# Patient Record
Sex: Female | Born: 1955 | Hispanic: No | Marital: Married | Smoking: Current every day smoker
Health system: Southern US, Community
[De-identification: ages and names within clinical notes are randomized; demographics above are authoritative.]

## PROBLEM LIST (undated history)

## (undated) DIAGNOSIS — I639 Cerebral infarction, unspecified: Secondary | ICD-10-CM

---

## 2000-08-31 ENCOUNTER — Other Ambulatory Visit: Admission: RE | Admit: 2000-08-31 | Discharge: 2000-08-31 | Payer: Self-pay | Admitting: Obstetrics and Gynecology

## 2004-06-16 ENCOUNTER — Ambulatory Visit: Payer: Self-pay | Admitting: Cardiology

## 2004-06-16 ENCOUNTER — Observation Stay (HOSPITAL_COMMUNITY): Admission: EM | Admit: 2004-06-16 | Discharge: 2004-06-17 | Payer: Self-pay | Admitting: Cardiology

## 2005-12-27 IMAGING — CR DG CHEST 2V
2 series · 2 of 2 positions shown · non-contrast
Comparison: none

CLINICAL DATA: 48-year-old female with chest pain, shortness of breath.  
 2-VIEW CHEST RADIOGRAPH:

[w chest pa]
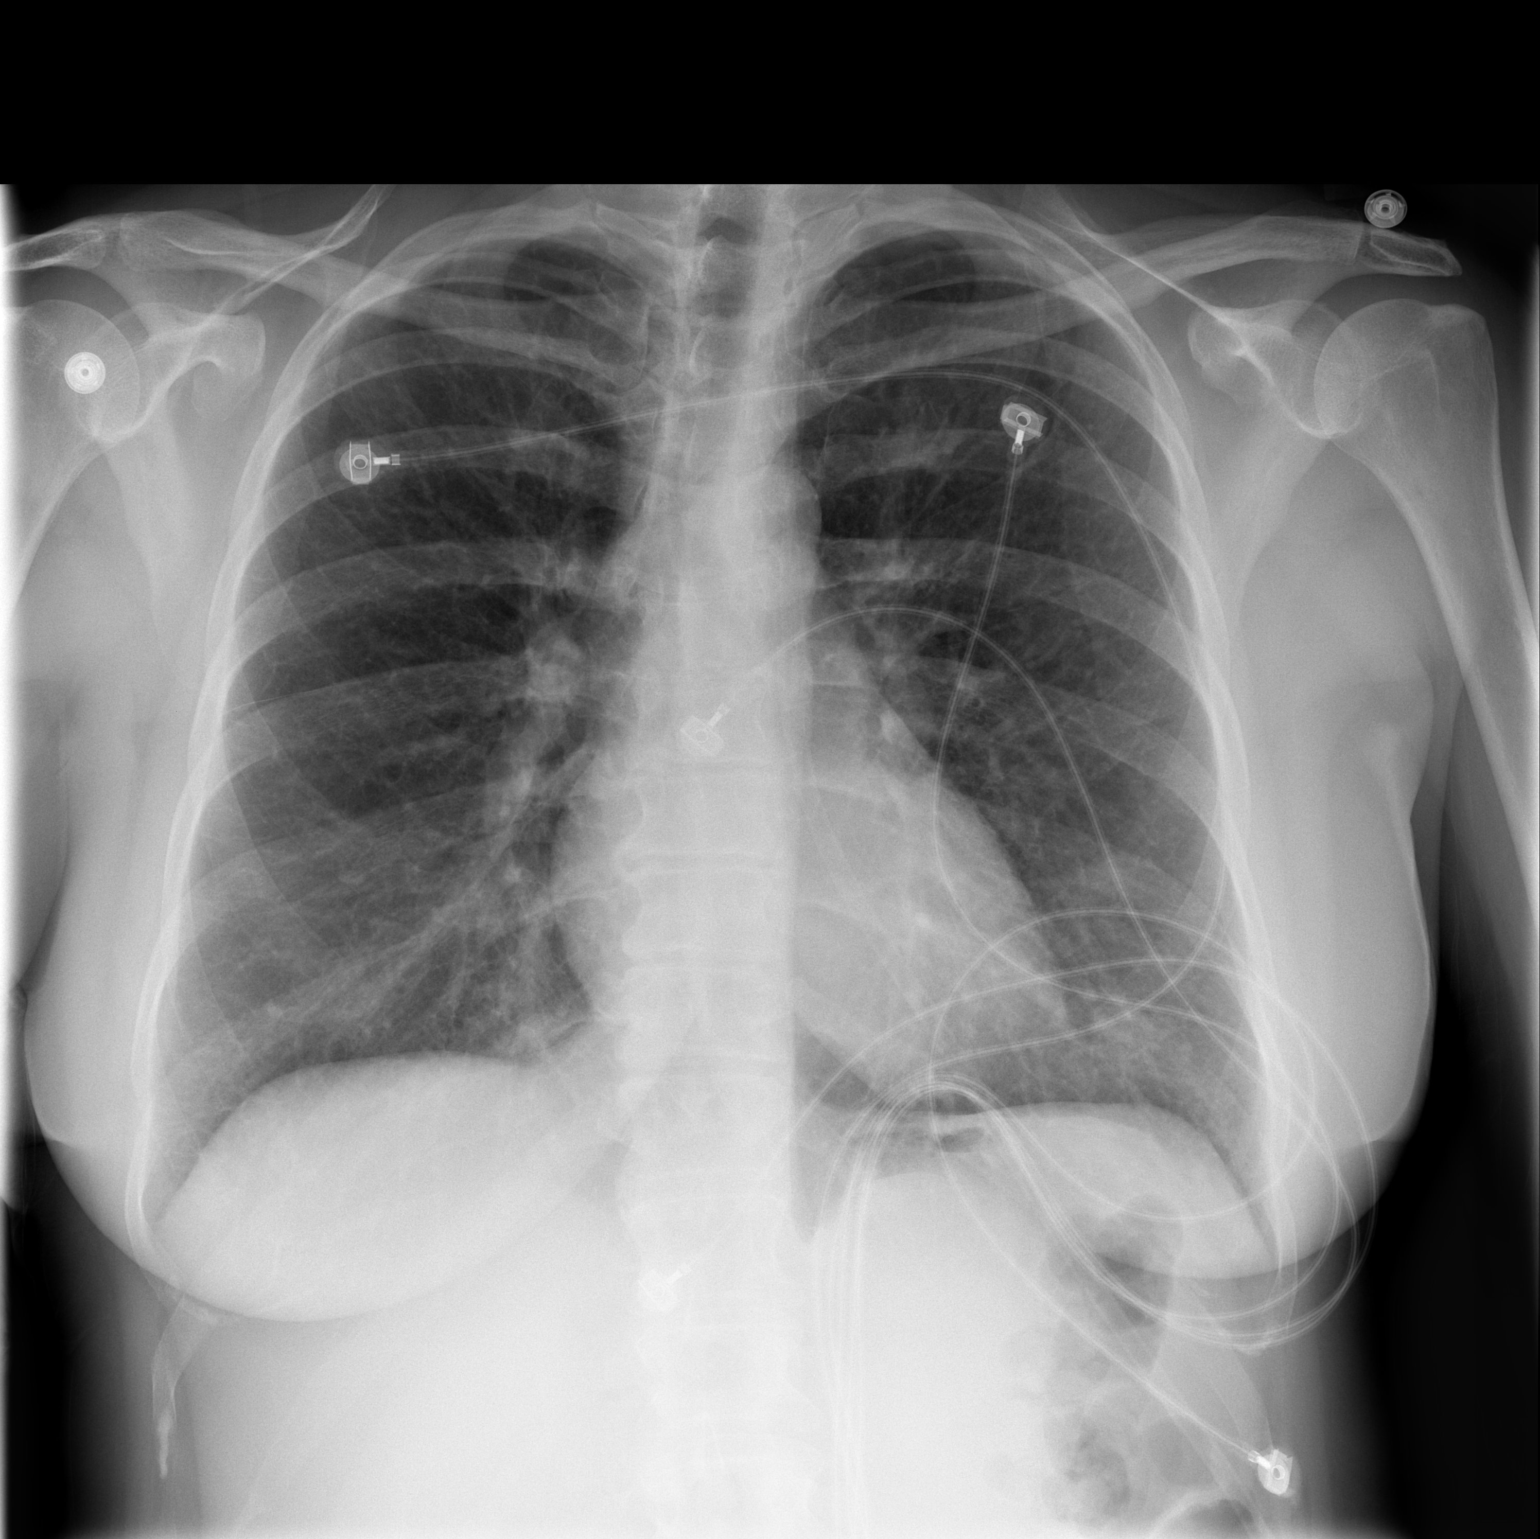

[w chest lat]
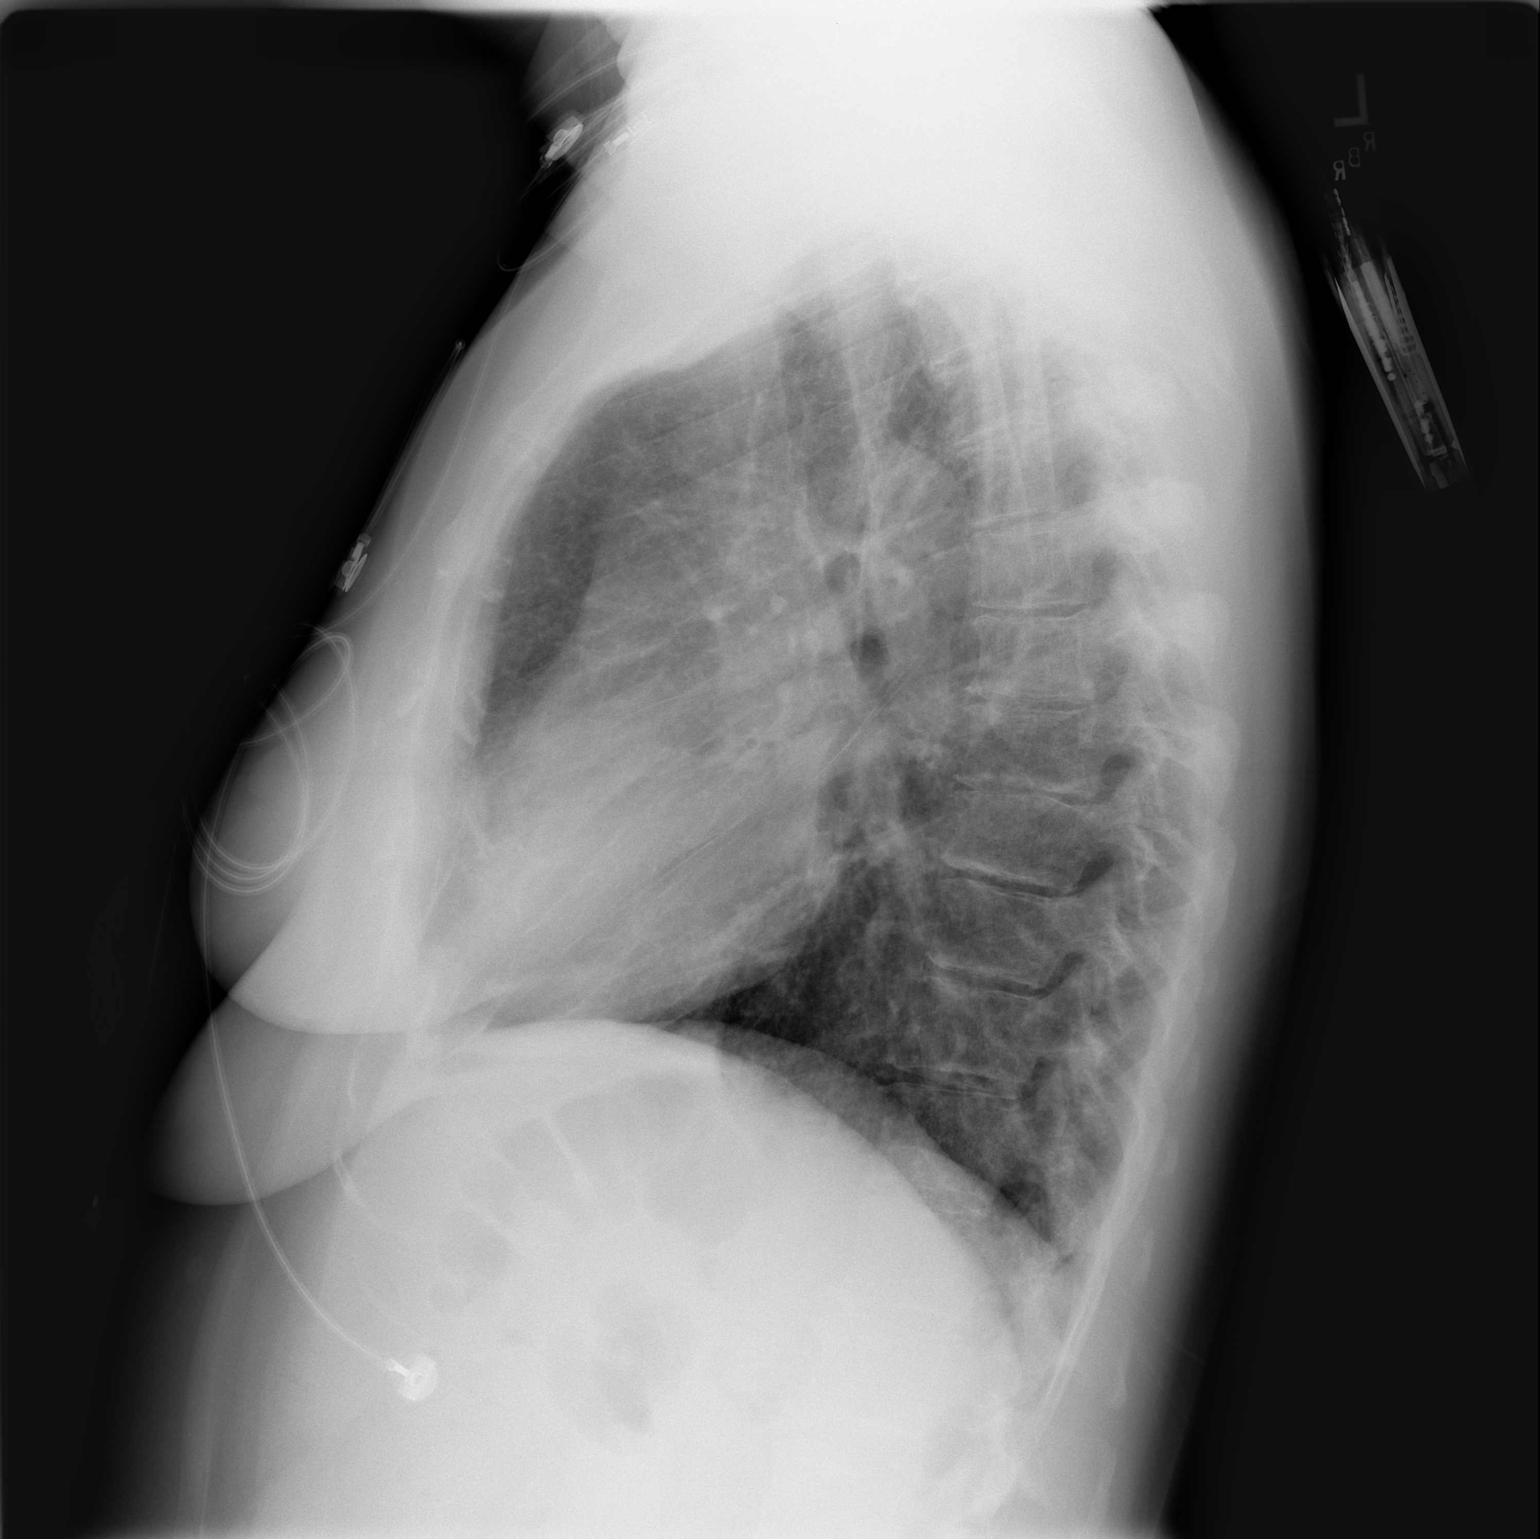

[2 of 2 positions shown; findings below may reference images not displayed]

FINDINGS: Central peribronchial changes are present with interstitial prominence diffusely.  Minimal bibasilar atelectasis.  No CHF, pneumonia, effusion, or pneumothorax.  Normal heart size.
IMPRESSION: 1.  Central peribronchial changes and interstitial prominence.  Query chronic bronchitis or history of smoking.  
 2.  No acute CHF or pneumonia.

## 2011-10-12 ENCOUNTER — Other Ambulatory Visit: Payer: Self-pay | Admitting: Family Medicine

## 2012-05-12 ENCOUNTER — Other Ambulatory Visit: Payer: Self-pay | Admitting: *Deleted

## 2015-03-04 DIAGNOSIS — R69 Illness, unspecified: Secondary | ICD-10-CM | POA: Diagnosis not present

## 2015-03-04 DIAGNOSIS — I10 Essential (primary) hypertension: Secondary | ICD-10-CM | POA: Diagnosis not present

## 2015-05-06 DIAGNOSIS — M5117 Intervertebral disc disorders with radiculopathy, lumbosacral region: Secondary | ICD-10-CM | POA: Diagnosis not present

## 2015-05-06 DIAGNOSIS — M5116 Intervertebral disc disorders with radiculopathy, lumbar region: Secondary | ICD-10-CM | POA: Diagnosis not present

## 2015-05-13 DIAGNOSIS — R69 Illness, unspecified: Secondary | ICD-10-CM | POA: Diagnosis not present

## 2015-05-13 DIAGNOSIS — Z720 Tobacco use: Secondary | ICD-10-CM | POA: Diagnosis not present

## 2015-05-13 DIAGNOSIS — I10 Essential (primary) hypertension: Secondary | ICD-10-CM | POA: Diagnosis not present

## 2015-05-13 DIAGNOSIS — E785 Hyperlipidemia, unspecified: Secondary | ICD-10-CM | POA: Diagnosis not present

## 2015-07-15 DIAGNOSIS — Z79899 Other long term (current) drug therapy: Secondary | ICD-10-CM | POA: Diagnosis not present

## 2015-07-15 DIAGNOSIS — M5116 Intervertebral disc disorders with radiculopathy, lumbar region: Secondary | ICD-10-CM | POA: Diagnosis not present

## 2015-08-09 DIAGNOSIS — I69359 Hemiplegia and hemiparesis following cerebral infarction affecting unspecified side: Secondary | ICD-10-CM | POA: Diagnosis not present

## 2015-08-09 DIAGNOSIS — I10 Essential (primary) hypertension: Secondary | ICD-10-CM | POA: Diagnosis not present

## 2015-08-09 DIAGNOSIS — E785 Hyperlipidemia, unspecified: Secondary | ICD-10-CM | POA: Diagnosis not present

## 2015-08-09 DIAGNOSIS — R69 Illness, unspecified: Secondary | ICD-10-CM | POA: Diagnosis not present

## 2015-10-17 DIAGNOSIS — M5116 Intervertebral disc disorders with radiculopathy, lumbar region: Secondary | ICD-10-CM | POA: Diagnosis not present

## 2015-11-06 DIAGNOSIS — J449 Chronic obstructive pulmonary disease, unspecified: Secondary | ICD-10-CM | POA: Diagnosis not present

## 2015-11-06 DIAGNOSIS — Z87891 Personal history of nicotine dependence: Secondary | ICD-10-CM | POA: Diagnosis not present

## 2015-11-06 DIAGNOSIS — E785 Hyperlipidemia, unspecified: Secondary | ICD-10-CM | POA: Diagnosis not present

## 2015-11-06 DIAGNOSIS — I10 Essential (primary) hypertension: Secondary | ICD-10-CM | POA: Diagnosis not present

## 2015-11-06 DIAGNOSIS — Z720 Tobacco use: Secondary | ICD-10-CM | POA: Diagnosis not present

## 2015-11-18 DIAGNOSIS — Z79899 Other long term (current) drug therapy: Secondary | ICD-10-CM | POA: Diagnosis not present

## 2015-11-18 DIAGNOSIS — M5116 Intervertebral disc disorders with radiculopathy, lumbar region: Secondary | ICD-10-CM | POA: Diagnosis not present

## 2015-11-18 DIAGNOSIS — M5117 Intervertebral disc disorders with radiculopathy, lumbosacral region: Secondary | ICD-10-CM | POA: Diagnosis not present

## 2016-01-09 DIAGNOSIS — Z79899 Other long term (current) drug therapy: Secondary | ICD-10-CM | POA: Diagnosis not present

## 2016-01-09 DIAGNOSIS — M5116 Intervertebral disc disorders with radiculopathy, lumbar region: Secondary | ICD-10-CM | POA: Diagnosis not present

## 2016-01-14 DIAGNOSIS — I10 Essential (primary) hypertension: Secondary | ICD-10-CM | POA: Diagnosis not present

## 2016-01-14 DIAGNOSIS — Z6825 Body mass index (BMI) 25.0-25.9, adult: Secondary | ICD-10-CM | POA: Diagnosis not present

## 2016-01-14 DIAGNOSIS — J410 Simple chronic bronchitis: Secondary | ICD-10-CM | POA: Diagnosis not present

## 2016-01-14 DIAGNOSIS — E782 Mixed hyperlipidemia: Secondary | ICD-10-CM | POA: Diagnosis not present

## 2016-03-18 DIAGNOSIS — Z79899 Other long term (current) drug therapy: Secondary | ICD-10-CM | POA: Diagnosis not present

## 2016-03-18 DIAGNOSIS — M5116 Intervertebral disc disorders with radiculopathy, lumbar region: Secondary | ICD-10-CM | POA: Diagnosis not present

## 2016-04-13 DIAGNOSIS — E785 Hyperlipidemia, unspecified: Secondary | ICD-10-CM | POA: Diagnosis not present

## 2016-04-13 DIAGNOSIS — J449 Chronic obstructive pulmonary disease, unspecified: Secondary | ICD-10-CM | POA: Diagnosis not present

## 2016-04-13 DIAGNOSIS — E871 Hypo-osmolality and hyponatremia: Secondary | ICD-10-CM | POA: Diagnosis not present

## 2016-04-13 DIAGNOSIS — I10 Essential (primary) hypertension: Secondary | ICD-10-CM | POA: Diagnosis not present

## 2016-06-11 DIAGNOSIS — M5116 Intervertebral disc disorders with radiculopathy, lumbar region: Secondary | ICD-10-CM | POA: Diagnosis not present

## 2016-07-15 DIAGNOSIS — I10 Essential (primary) hypertension: Secondary | ICD-10-CM | POA: Diagnosis not present

## 2016-07-15 DIAGNOSIS — E785 Hyperlipidemia, unspecified: Secondary | ICD-10-CM | POA: Diagnosis not present

## 2016-07-15 DIAGNOSIS — J449 Chronic obstructive pulmonary disease, unspecified: Secondary | ICD-10-CM | POA: Diagnosis not present

## 2016-09-03 DIAGNOSIS — Z79891 Long term (current) use of opiate analgesic: Secondary | ICD-10-CM | POA: Diagnosis not present

## 2016-09-03 DIAGNOSIS — M5136 Other intervertebral disc degeneration, lumbar region: Secondary | ICD-10-CM | POA: Diagnosis not present

## 2016-09-03 DIAGNOSIS — Z5181 Encounter for therapeutic drug level monitoring: Secondary | ICD-10-CM | POA: Diagnosis not present

## 2016-09-14 DIAGNOSIS — H524 Presbyopia: Secondary | ICD-10-CM | POA: Diagnosis not present

## 2016-10-13 DIAGNOSIS — H25041 Posterior subcapsular polar age-related cataract, right eye: Secondary | ICD-10-CM | POA: Diagnosis not present

## 2016-10-13 DIAGNOSIS — H40003 Preglaucoma, unspecified, bilateral: Secondary | ICD-10-CM | POA: Diagnosis not present

## 2016-10-13 DIAGNOSIS — H2512 Age-related nuclear cataract, left eye: Secondary | ICD-10-CM | POA: Diagnosis not present

## 2016-10-15 DIAGNOSIS — Z5181 Encounter for therapeutic drug level monitoring: Secondary | ICD-10-CM | POA: Diagnosis not present

## 2016-10-15 DIAGNOSIS — Z79891 Long term (current) use of opiate analgesic: Secondary | ICD-10-CM | POA: Diagnosis not present

## 2016-10-15 DIAGNOSIS — M5136 Other intervertebral disc degeneration, lumbar region: Secondary | ICD-10-CM | POA: Diagnosis not present

## 2016-10-19 DIAGNOSIS — H02041 Spastic entropion of right upper eyelid: Secondary | ICD-10-CM | POA: Diagnosis not present

## 2016-10-19 DIAGNOSIS — H2511 Age-related nuclear cataract, right eye: Secondary | ICD-10-CM | POA: Diagnosis not present

## 2016-10-19 DIAGNOSIS — H25041 Posterior subcapsular polar age-related cataract, right eye: Secondary | ICD-10-CM | POA: Diagnosis not present

## 2016-11-16 DIAGNOSIS — H25042 Posterior subcapsular polar age-related cataract, left eye: Secondary | ICD-10-CM | POA: Diagnosis not present

## 2016-11-16 DIAGNOSIS — H2512 Age-related nuclear cataract, left eye: Secondary | ICD-10-CM | POA: Diagnosis not present

## 2017-01-13 DIAGNOSIS — I10 Essential (primary) hypertension: Secondary | ICD-10-CM | POA: Diagnosis not present

## 2017-01-13 DIAGNOSIS — E785 Hyperlipidemia, unspecified: Secondary | ICD-10-CM | POA: Diagnosis not present

## 2017-01-13 DIAGNOSIS — J449 Chronic obstructive pulmonary disease, unspecified: Secondary | ICD-10-CM | POA: Diagnosis not present

## 2017-01-13 DIAGNOSIS — M5442 Lumbago with sciatica, left side: Secondary | ICD-10-CM | POA: Diagnosis not present

## 2017-01-25 DIAGNOSIS — G8929 Other chronic pain: Secondary | ICD-10-CM | POA: Diagnosis not present

## 2017-01-25 DIAGNOSIS — Z5181 Encounter for therapeutic drug level monitoring: Secondary | ICD-10-CM | POA: Diagnosis not present

## 2017-01-25 DIAGNOSIS — Z87891 Personal history of nicotine dependence: Secondary | ICD-10-CM | POA: Diagnosis not present

## 2017-01-25 DIAGNOSIS — M5442 Lumbago with sciatica, left side: Secondary | ICD-10-CM | POA: Diagnosis not present

## 2017-02-24 DIAGNOSIS — Z5181 Encounter for therapeutic drug level monitoring: Secondary | ICD-10-CM | POA: Diagnosis not present

## 2017-02-24 DIAGNOSIS — I10 Essential (primary) hypertension: Secondary | ICD-10-CM | POA: Diagnosis not present

## 2017-02-24 DIAGNOSIS — M5442 Lumbago with sciatica, left side: Secondary | ICD-10-CM | POA: Diagnosis not present

## 2017-02-24 DIAGNOSIS — E785 Hyperlipidemia, unspecified: Secondary | ICD-10-CM | POA: Diagnosis not present

## 2017-02-24 DIAGNOSIS — J449 Chronic obstructive pulmonary disease, unspecified: Secondary | ICD-10-CM | POA: Diagnosis not present

## 2017-02-24 DIAGNOSIS — Z79899 Other long term (current) drug therapy: Secondary | ICD-10-CM | POA: Diagnosis not present

## 2017-03-26 DIAGNOSIS — I10 Essential (primary) hypertension: Secondary | ICD-10-CM | POA: Diagnosis not present

## 2017-03-26 DIAGNOSIS — M5442 Lumbago with sciatica, left side: Secondary | ICD-10-CM | POA: Diagnosis not present

## 2017-03-26 DIAGNOSIS — G8929 Other chronic pain: Secondary | ICD-10-CM | POA: Diagnosis not present

## 2017-03-26 DIAGNOSIS — K5903 Drug induced constipation: Secondary | ICD-10-CM | POA: Diagnosis not present

## 2017-04-23 DIAGNOSIS — M5442 Lumbago with sciatica, left side: Secondary | ICD-10-CM | POA: Diagnosis not present

## 2017-04-23 DIAGNOSIS — Z79899 Other long term (current) drug therapy: Secondary | ICD-10-CM | POA: Diagnosis not present

## 2017-04-23 DIAGNOSIS — E785 Hyperlipidemia, unspecified: Secondary | ICD-10-CM | POA: Diagnosis not present

## 2017-04-23 DIAGNOSIS — G8929 Other chronic pain: Secondary | ICD-10-CM | POA: Diagnosis not present

## 2017-04-23 DIAGNOSIS — Z5181 Encounter for therapeutic drug level monitoring: Secondary | ICD-10-CM | POA: Diagnosis not present

## 2017-04-23 DIAGNOSIS — I10 Essential (primary) hypertension: Secondary | ICD-10-CM | POA: Diagnosis not present

## 2017-05-24 DIAGNOSIS — Z79899 Other long term (current) drug therapy: Secondary | ICD-10-CM | POA: Diagnosis not present

## 2017-05-24 DIAGNOSIS — E785 Hyperlipidemia, unspecified: Secondary | ICD-10-CM | POA: Diagnosis not present

## 2017-05-24 DIAGNOSIS — G8929 Other chronic pain: Secondary | ICD-10-CM | POA: Diagnosis not present

## 2017-05-24 DIAGNOSIS — I10 Essential (primary) hypertension: Secondary | ICD-10-CM | POA: Diagnosis not present

## 2017-05-24 DIAGNOSIS — M5442 Lumbago with sciatica, left side: Secondary | ICD-10-CM | POA: Diagnosis not present

## 2017-05-24 DIAGNOSIS — Z5181 Encounter for therapeutic drug level monitoring: Secondary | ICD-10-CM | POA: Diagnosis not present

## 2017-06-23 DIAGNOSIS — K5903 Drug induced constipation: Secondary | ICD-10-CM | POA: Diagnosis not present

## 2017-06-23 DIAGNOSIS — E785 Hyperlipidemia, unspecified: Secondary | ICD-10-CM | POA: Diagnosis not present

## 2017-06-23 DIAGNOSIS — Z79899 Other long term (current) drug therapy: Secondary | ICD-10-CM | POA: Diagnosis not present

## 2017-06-23 DIAGNOSIS — I10 Essential (primary) hypertension: Secondary | ICD-10-CM | POA: Diagnosis not present

## 2017-06-23 DIAGNOSIS — M5442 Lumbago with sciatica, left side: Secondary | ICD-10-CM | POA: Diagnosis not present

## 2017-06-23 DIAGNOSIS — Z5181 Encounter for therapeutic drug level monitoring: Secondary | ICD-10-CM | POA: Diagnosis not present

## 2017-06-23 DIAGNOSIS — G8929 Other chronic pain: Secondary | ICD-10-CM | POA: Diagnosis not present

## 2017-07-21 DIAGNOSIS — M5442 Lumbago with sciatica, left side: Secondary | ICD-10-CM | POA: Diagnosis not present

## 2017-07-21 DIAGNOSIS — G8929 Other chronic pain: Secondary | ICD-10-CM | POA: Diagnosis not present

## 2017-07-21 DIAGNOSIS — I10 Essential (primary) hypertension: Secondary | ICD-10-CM | POA: Diagnosis not present

## 2017-07-21 DIAGNOSIS — Z79899 Other long term (current) drug therapy: Secondary | ICD-10-CM | POA: Diagnosis not present

## 2017-07-21 DIAGNOSIS — K5903 Drug induced constipation: Secondary | ICD-10-CM | POA: Diagnosis not present

## 2017-07-21 DIAGNOSIS — Z5181 Encounter for therapeutic drug level monitoring: Secondary | ICD-10-CM | POA: Diagnosis not present

## 2017-08-20 DIAGNOSIS — I10 Essential (primary) hypertension: Secondary | ICD-10-CM | POA: Diagnosis not present

## 2017-08-20 DIAGNOSIS — K5903 Drug induced constipation: Secondary | ICD-10-CM | POA: Diagnosis not present

## 2017-08-20 DIAGNOSIS — Z79899 Other long term (current) drug therapy: Secondary | ICD-10-CM | POA: Diagnosis not present

## 2017-08-20 DIAGNOSIS — Z5181 Encounter for therapeutic drug level monitoring: Secondary | ICD-10-CM | POA: Diagnosis not present

## 2017-08-20 DIAGNOSIS — G8929 Other chronic pain: Secondary | ICD-10-CM | POA: Diagnosis not present

## 2017-08-20 DIAGNOSIS — M5442 Lumbago with sciatica, left side: Secondary | ICD-10-CM | POA: Diagnosis not present

## 2017-09-21 DIAGNOSIS — I10 Essential (primary) hypertension: Secondary | ICD-10-CM | POA: Diagnosis not present

## 2017-09-21 DIAGNOSIS — Z79899 Other long term (current) drug therapy: Secondary | ICD-10-CM | POA: Diagnosis not present

## 2017-09-21 DIAGNOSIS — Z5181 Encounter for therapeutic drug level monitoring: Secondary | ICD-10-CM | POA: Diagnosis not present

## 2017-09-21 DIAGNOSIS — M5442 Lumbago with sciatica, left side: Secondary | ICD-10-CM | POA: Diagnosis not present

## 2017-09-21 DIAGNOSIS — G8929 Other chronic pain: Secondary | ICD-10-CM | POA: Diagnosis not present

## 2017-09-21 DIAGNOSIS — K5903 Drug induced constipation: Secondary | ICD-10-CM | POA: Diagnosis not present

## 2017-09-24 DIAGNOSIS — E7800 Pure hypercholesterolemia, unspecified: Secondary | ICD-10-CM | POA: Diagnosis not present

## 2017-09-24 DIAGNOSIS — Z720 Tobacco use: Secondary | ICD-10-CM | POA: Diagnosis not present

## 2017-09-24 DIAGNOSIS — I10 Essential (primary) hypertension: Secondary | ICD-10-CM | POA: Diagnosis not present

## 2017-09-24 DIAGNOSIS — R7303 Prediabetes: Secondary | ICD-10-CM | POA: Diagnosis not present

## 2017-09-24 DIAGNOSIS — Z1389 Encounter for screening for other disorder: Secondary | ICD-10-CM | POA: Diagnosis not present

## 2017-09-24 DIAGNOSIS — Z87891 Personal history of nicotine dependence: Secondary | ICD-10-CM | POA: Diagnosis not present

## 2017-09-24 DIAGNOSIS — Z0000 Encounter for general adult medical examination without abnormal findings: Secondary | ICD-10-CM | POA: Diagnosis not present

## 2017-10-22 DIAGNOSIS — Z5181 Encounter for therapeutic drug level monitoring: Secondary | ICD-10-CM | POA: Diagnosis not present

## 2017-10-22 DIAGNOSIS — I10 Essential (primary) hypertension: Secondary | ICD-10-CM | POA: Diagnosis not present

## 2017-10-22 DIAGNOSIS — M5442 Lumbago with sciatica, left side: Secondary | ICD-10-CM | POA: Diagnosis not present

## 2017-10-22 DIAGNOSIS — G8929 Other chronic pain: Secondary | ICD-10-CM | POA: Diagnosis not present

## 2017-10-22 DIAGNOSIS — E782 Mixed hyperlipidemia: Secondary | ICD-10-CM | POA: Diagnosis not present

## 2017-10-22 DIAGNOSIS — Z79899 Other long term (current) drug therapy: Secondary | ICD-10-CM | POA: Diagnosis not present

## 2017-11-19 DIAGNOSIS — Z5181 Encounter for therapeutic drug level monitoring: Secondary | ICD-10-CM | POA: Diagnosis not present

## 2017-11-19 DIAGNOSIS — M7062 Trochanteric bursitis, left hip: Secondary | ICD-10-CM | POA: Diagnosis not present

## 2017-11-19 DIAGNOSIS — E782 Mixed hyperlipidemia: Secondary | ICD-10-CM | POA: Diagnosis not present

## 2017-11-19 DIAGNOSIS — G8929 Other chronic pain: Secondary | ICD-10-CM | POA: Diagnosis not present

## 2017-11-19 DIAGNOSIS — Z79899 Other long term (current) drug therapy: Secondary | ICD-10-CM | POA: Diagnosis not present

## 2017-11-19 DIAGNOSIS — M5442 Lumbago with sciatica, left side: Secondary | ICD-10-CM | POA: Diagnosis not present

## 2017-11-29 DIAGNOSIS — M7062 Trochanteric bursitis, left hip: Secondary | ICD-10-CM | POA: Diagnosis not present

## 2017-12-17 DIAGNOSIS — G8929 Other chronic pain: Secondary | ICD-10-CM | POA: Diagnosis not present

## 2017-12-17 DIAGNOSIS — M5442 Lumbago with sciatica, left side: Secondary | ICD-10-CM | POA: Diagnosis not present

## 2017-12-17 DIAGNOSIS — K5903 Drug induced constipation: Secondary | ICD-10-CM | POA: Diagnosis not present

## 2017-12-17 DIAGNOSIS — Z5181 Encounter for therapeutic drug level monitoring: Secondary | ICD-10-CM | POA: Diagnosis not present

## 2017-12-17 DIAGNOSIS — Z79899 Other long term (current) drug therapy: Secondary | ICD-10-CM | POA: Diagnosis not present

## 2017-12-17 DIAGNOSIS — I10 Essential (primary) hypertension: Secondary | ICD-10-CM | POA: Diagnosis not present

## 2018-01-14 DIAGNOSIS — G8929 Other chronic pain: Secondary | ICD-10-CM | POA: Diagnosis not present

## 2018-01-14 DIAGNOSIS — I10 Essential (primary) hypertension: Secondary | ICD-10-CM | POA: Diagnosis not present

## 2018-01-14 DIAGNOSIS — M5442 Lumbago with sciatica, left side: Secondary | ICD-10-CM | POA: Diagnosis not present

## 2018-01-14 DIAGNOSIS — K5903 Drug induced constipation: Secondary | ICD-10-CM | POA: Diagnosis not present

## 2018-02-11 DIAGNOSIS — Z5181 Encounter for therapeutic drug level monitoring: Secondary | ICD-10-CM | POA: Diagnosis not present

## 2018-02-11 DIAGNOSIS — I10 Essential (primary) hypertension: Secondary | ICD-10-CM | POA: Diagnosis not present

## 2018-02-11 DIAGNOSIS — G8929 Other chronic pain: Secondary | ICD-10-CM | POA: Diagnosis not present

## 2018-02-11 DIAGNOSIS — M5442 Lumbago with sciatica, left side: Secondary | ICD-10-CM | POA: Diagnosis not present

## 2018-02-11 DIAGNOSIS — Z79899 Other long term (current) drug therapy: Secondary | ICD-10-CM | POA: Diagnosis not present

## 2018-02-11 DIAGNOSIS — K5903 Drug induced constipation: Secondary | ICD-10-CM | POA: Diagnosis not present

## 2018-03-15 DIAGNOSIS — M5442 Lumbago with sciatica, left side: Secondary | ICD-10-CM | POA: Diagnosis not present

## 2018-03-15 DIAGNOSIS — K5903 Drug induced constipation: Secondary | ICD-10-CM | POA: Diagnosis not present

## 2018-03-15 DIAGNOSIS — Z5181 Encounter for therapeutic drug level monitoring: Secondary | ICD-10-CM | POA: Diagnosis not present

## 2018-03-15 DIAGNOSIS — G8929 Other chronic pain: Secondary | ICD-10-CM | POA: Diagnosis not present

## 2018-03-15 DIAGNOSIS — Z79899 Other long term (current) drug therapy: Secondary | ICD-10-CM | POA: Diagnosis not present

## 2018-03-15 DIAGNOSIS — I10 Essential (primary) hypertension: Secondary | ICD-10-CM | POA: Diagnosis not present

## 2018-03-29 DIAGNOSIS — R39198 Other difficulties with micturition: Secondary | ICD-10-CM | POA: Diagnosis not present

## 2018-04-13 DIAGNOSIS — Z79899 Other long term (current) drug therapy: Secondary | ICD-10-CM | POA: Diagnosis not present

## 2018-04-13 DIAGNOSIS — I10 Essential (primary) hypertension: Secondary | ICD-10-CM | POA: Diagnosis not present

## 2018-04-13 DIAGNOSIS — G8929 Other chronic pain: Secondary | ICD-10-CM | POA: Diagnosis not present

## 2018-04-13 DIAGNOSIS — Z5181 Encounter for therapeutic drug level monitoring: Secondary | ICD-10-CM | POA: Diagnosis not present

## 2018-04-13 DIAGNOSIS — M5442 Lumbago with sciatica, left side: Secondary | ICD-10-CM | POA: Diagnosis not present

## 2018-04-13 DIAGNOSIS — R300 Dysuria: Secondary | ICD-10-CM | POA: Diagnosis not present

## 2018-04-13 DIAGNOSIS — K5903 Drug induced constipation: Secondary | ICD-10-CM | POA: Diagnosis not present

## 2018-05-13 DIAGNOSIS — G8929 Other chronic pain: Secondary | ICD-10-CM | POA: Diagnosis not present

## 2018-05-13 DIAGNOSIS — K5903 Drug induced constipation: Secondary | ICD-10-CM | POA: Diagnosis not present

## 2018-05-13 DIAGNOSIS — I10 Essential (primary) hypertension: Secondary | ICD-10-CM | POA: Diagnosis not present

## 2018-05-13 DIAGNOSIS — E871 Hypo-osmolality and hyponatremia: Secondary | ICD-10-CM | POA: Diagnosis not present

## 2018-05-13 DIAGNOSIS — M5442 Lumbago with sciatica, left side: Secondary | ICD-10-CM | POA: Diagnosis not present

## 2018-05-13 DIAGNOSIS — Z5181 Encounter for therapeutic drug level monitoring: Secondary | ICD-10-CM | POA: Diagnosis not present

## 2018-05-13 DIAGNOSIS — Z79899 Other long term (current) drug therapy: Secondary | ICD-10-CM | POA: Diagnosis not present

## 2018-06-10 DIAGNOSIS — K5903 Drug induced constipation: Secondary | ICD-10-CM | POA: Diagnosis not present

## 2018-06-10 DIAGNOSIS — I10 Essential (primary) hypertension: Secondary | ICD-10-CM | POA: Diagnosis not present

## 2018-06-10 DIAGNOSIS — Z5181 Encounter for therapeutic drug level monitoring: Secondary | ICD-10-CM | POA: Diagnosis not present

## 2018-06-10 DIAGNOSIS — G8929 Other chronic pain: Secondary | ICD-10-CM | POA: Diagnosis not present

## 2018-06-10 DIAGNOSIS — M5442 Lumbago with sciatica, left side: Secondary | ICD-10-CM | POA: Diagnosis not present

## 2018-06-10 DIAGNOSIS — Z79899 Other long term (current) drug therapy: Secondary | ICD-10-CM | POA: Diagnosis not present

## 2018-07-11 DIAGNOSIS — Z5181 Encounter for therapeutic drug level monitoring: Secondary | ICD-10-CM | POA: Diagnosis not present

## 2018-07-11 DIAGNOSIS — R69 Illness, unspecified: Secondary | ICD-10-CM | POA: Diagnosis not present

## 2018-07-11 DIAGNOSIS — M5442 Lumbago with sciatica, left side: Secondary | ICD-10-CM | POA: Diagnosis not present

## 2018-07-11 DIAGNOSIS — K5903 Drug induced constipation: Secondary | ICD-10-CM | POA: Diagnosis not present

## 2018-07-11 DIAGNOSIS — G8929 Other chronic pain: Secondary | ICD-10-CM | POA: Diagnosis not present

## 2018-07-11 DIAGNOSIS — Z79899 Other long term (current) drug therapy: Secondary | ICD-10-CM | POA: Diagnosis not present

## 2018-07-11 DIAGNOSIS — I10 Essential (primary) hypertension: Secondary | ICD-10-CM | POA: Diagnosis not present

## 2018-08-10 DIAGNOSIS — G8929 Other chronic pain: Secondary | ICD-10-CM | POA: Diagnosis not present

## 2018-08-10 DIAGNOSIS — I10 Essential (primary) hypertension: Secondary | ICD-10-CM | POA: Diagnosis not present

## 2018-08-10 DIAGNOSIS — M5442 Lumbago with sciatica, left side: Secondary | ICD-10-CM | POA: Diagnosis not present

## 2018-08-10 DIAGNOSIS — Z79899 Other long term (current) drug therapy: Secondary | ICD-10-CM | POA: Diagnosis not present

## 2018-08-10 DIAGNOSIS — Z5181 Encounter for therapeutic drug level monitoring: Secondary | ICD-10-CM | POA: Diagnosis not present

## 2018-09-12 DIAGNOSIS — M5442 Lumbago with sciatica, left side: Secondary | ICD-10-CM | POA: Diagnosis not present

## 2018-09-12 DIAGNOSIS — G8929 Other chronic pain: Secondary | ICD-10-CM | POA: Diagnosis not present

## 2018-09-12 DIAGNOSIS — I10 Essential (primary) hypertension: Secondary | ICD-10-CM | POA: Diagnosis not present

## 2018-09-12 DIAGNOSIS — Z5181 Encounter for therapeutic drug level monitoring: Secondary | ICD-10-CM | POA: Diagnosis not present

## 2018-09-12 DIAGNOSIS — Z79899 Other long term (current) drug therapy: Secondary | ICD-10-CM | POA: Diagnosis not present

## 2018-10-12 DIAGNOSIS — G8929 Other chronic pain: Secondary | ICD-10-CM | POA: Diagnosis not present

## 2018-10-12 DIAGNOSIS — I10 Essential (primary) hypertension: Secondary | ICD-10-CM | POA: Diagnosis not present

## 2018-10-12 DIAGNOSIS — M5442 Lumbago with sciatica, left side: Secondary | ICD-10-CM | POA: Diagnosis not present

## 2018-10-12 DIAGNOSIS — R69 Illness, unspecified: Secondary | ICD-10-CM | POA: Diagnosis not present

## 2018-11-11 DIAGNOSIS — G8929 Other chronic pain: Secondary | ICD-10-CM | POA: Diagnosis not present

## 2018-11-11 DIAGNOSIS — M5442 Lumbago with sciatica, left side: Secondary | ICD-10-CM | POA: Diagnosis not present

## 2018-11-11 DIAGNOSIS — Z79899 Other long term (current) drug therapy: Secondary | ICD-10-CM | POA: Diagnosis not present

## 2018-11-11 DIAGNOSIS — Z5181 Encounter for therapeutic drug level monitoring: Secondary | ICD-10-CM | POA: Diagnosis not present

## 2018-11-11 DIAGNOSIS — I10 Essential (primary) hypertension: Secondary | ICD-10-CM | POA: Diagnosis not present

## 2018-12-05 DIAGNOSIS — I10 Essential (primary) hypertension: Secondary | ICD-10-CM | POA: Diagnosis not present

## 2018-12-05 DIAGNOSIS — Z1389 Encounter for screening for other disorder: Secondary | ICD-10-CM | POA: Diagnosis not present

## 2018-12-05 DIAGNOSIS — Z0000 Encounter for general adult medical examination without abnormal findings: Secondary | ICD-10-CM | POA: Diagnosis not present

## 2018-12-05 DIAGNOSIS — G8194 Hemiplegia, unspecified affecting left nondominant side: Secondary | ICD-10-CM | POA: Diagnosis not present

## 2018-12-05 DIAGNOSIS — Z87891 Personal history of nicotine dependence: Secondary | ICD-10-CM | POA: Diagnosis not present

## 2018-12-05 DIAGNOSIS — Z23 Encounter for immunization: Secondary | ICD-10-CM | POA: Diagnosis not present

## 2018-12-05 DIAGNOSIS — R4182 Altered mental status, unspecified: Secondary | ICD-10-CM | POA: Diagnosis not present

## 2018-12-05 DIAGNOSIS — E782 Mixed hyperlipidemia: Secondary | ICD-10-CM | POA: Diagnosis not present

## 2018-12-13 DIAGNOSIS — R4182 Altered mental status, unspecified: Secondary | ICD-10-CM | POA: Diagnosis not present

## 2018-12-16 DIAGNOSIS — R39198 Other difficulties with micturition: Secondary | ICD-10-CM | POA: Diagnosis not present

## 2018-12-16 DIAGNOSIS — Z79899 Other long term (current) drug therapy: Secondary | ICD-10-CM | POA: Diagnosis not present

## 2018-12-16 DIAGNOSIS — M5442 Lumbago with sciatica, left side: Secondary | ICD-10-CM | POA: Diagnosis not present

## 2018-12-16 DIAGNOSIS — G8929 Other chronic pain: Secondary | ICD-10-CM | POA: Diagnosis not present

## 2018-12-16 DIAGNOSIS — Z5181 Encounter for therapeutic drug level monitoring: Secondary | ICD-10-CM | POA: Diagnosis not present

## 2018-12-16 DIAGNOSIS — R69 Illness, unspecified: Secondary | ICD-10-CM | POA: Diagnosis not present

## 2019-01-13 DIAGNOSIS — Z79899 Other long term (current) drug therapy: Secondary | ICD-10-CM | POA: Diagnosis not present

## 2019-01-13 DIAGNOSIS — R69 Illness, unspecified: Secondary | ICD-10-CM | POA: Diagnosis not present

## 2019-01-13 DIAGNOSIS — Z5181 Encounter for therapeutic drug level monitoring: Secondary | ICD-10-CM | POA: Diagnosis not present

## 2019-01-13 DIAGNOSIS — I10 Essential (primary) hypertension: Secondary | ICD-10-CM | POA: Diagnosis not present

## 2019-01-13 DIAGNOSIS — G8929 Other chronic pain: Secondary | ICD-10-CM | POA: Diagnosis not present

## 2019-01-13 DIAGNOSIS — M5442 Lumbago with sciatica, left side: Secondary | ICD-10-CM | POA: Diagnosis not present

## 2019-02-14 DIAGNOSIS — M5442 Lumbago with sciatica, left side: Secondary | ICD-10-CM | POA: Diagnosis not present

## 2019-02-14 DIAGNOSIS — G8929 Other chronic pain: Secondary | ICD-10-CM | POA: Diagnosis not present

## 2019-02-14 DIAGNOSIS — R69 Illness, unspecified: Secondary | ICD-10-CM | POA: Diagnosis not present

## 2019-02-14 DIAGNOSIS — Z5181 Encounter for therapeutic drug level monitoring: Secondary | ICD-10-CM | POA: Diagnosis not present

## 2019-02-14 DIAGNOSIS — Z79899 Other long term (current) drug therapy: Secondary | ICD-10-CM | POA: Diagnosis not present

## 2019-02-14 DIAGNOSIS — I10 Essential (primary) hypertension: Secondary | ICD-10-CM | POA: Diagnosis not present

## 2019-03-15 DIAGNOSIS — M5442 Lumbago with sciatica, left side: Secondary | ICD-10-CM | POA: Diagnosis not present

## 2019-03-15 DIAGNOSIS — Z79899 Other long term (current) drug therapy: Secondary | ICD-10-CM | POA: Diagnosis not present

## 2019-03-15 DIAGNOSIS — I10 Essential (primary) hypertension: Secondary | ICD-10-CM | POA: Diagnosis not present

## 2019-03-15 DIAGNOSIS — G8929 Other chronic pain: Secondary | ICD-10-CM | POA: Diagnosis not present

## 2019-03-15 DIAGNOSIS — Z5181 Encounter for therapeutic drug level monitoring: Secondary | ICD-10-CM | POA: Diagnosis not present

## 2019-04-12 DIAGNOSIS — G8929 Other chronic pain: Secondary | ICD-10-CM | POA: Diagnosis not present

## 2019-04-12 DIAGNOSIS — I10 Essential (primary) hypertension: Secondary | ICD-10-CM | POA: Diagnosis not present

## 2019-04-12 DIAGNOSIS — R39198 Other difficulties with micturition: Secondary | ICD-10-CM | POA: Diagnosis not present

## 2019-04-12 DIAGNOSIS — M5442 Lumbago with sciatica, left side: Secondary | ICD-10-CM | POA: Diagnosis not present

## 2019-05-10 DIAGNOSIS — M5442 Lumbago with sciatica, left side: Secondary | ICD-10-CM | POA: Diagnosis not present

## 2019-05-10 DIAGNOSIS — Z79899 Other long term (current) drug therapy: Secondary | ICD-10-CM | POA: Diagnosis not present

## 2019-05-10 DIAGNOSIS — G8929 Other chronic pain: Secondary | ICD-10-CM | POA: Diagnosis not present

## 2019-05-10 DIAGNOSIS — Z5181 Encounter for therapeutic drug level monitoring: Secondary | ICD-10-CM | POA: Diagnosis not present

## 2019-05-10 DIAGNOSIS — I10 Essential (primary) hypertension: Secondary | ICD-10-CM | POA: Diagnosis not present

## 2019-06-07 DIAGNOSIS — G8929 Other chronic pain: Secondary | ICD-10-CM | POA: Diagnosis not present

## 2019-06-07 DIAGNOSIS — Z5181 Encounter for therapeutic drug level monitoring: Secondary | ICD-10-CM | POA: Diagnosis not present

## 2019-06-07 DIAGNOSIS — M5442 Lumbago with sciatica, left side: Secondary | ICD-10-CM | POA: Diagnosis not present

## 2019-06-07 DIAGNOSIS — I10 Essential (primary) hypertension: Secondary | ICD-10-CM | POA: Diagnosis not present

## 2019-06-07 DIAGNOSIS — Z79899 Other long term (current) drug therapy: Secondary | ICD-10-CM | POA: Diagnosis not present

## 2019-07-05 DIAGNOSIS — Z5181 Encounter for therapeutic drug level monitoring: Secondary | ICD-10-CM | POA: Diagnosis not present

## 2019-07-05 DIAGNOSIS — M5442 Lumbago with sciatica, left side: Secondary | ICD-10-CM | POA: Diagnosis not present

## 2019-07-05 DIAGNOSIS — Z79899 Other long term (current) drug therapy: Secondary | ICD-10-CM | POA: Diagnosis not present

## 2019-07-05 DIAGNOSIS — G8929 Other chronic pain: Secondary | ICD-10-CM | POA: Diagnosis not present

## 2019-07-05 DIAGNOSIS — I10 Essential (primary) hypertension: Secondary | ICD-10-CM | POA: Diagnosis not present

## 2019-08-02 DIAGNOSIS — I10 Essential (primary) hypertension: Secondary | ICD-10-CM | POA: Diagnosis not present

## 2019-08-02 DIAGNOSIS — Z79899 Other long term (current) drug therapy: Secondary | ICD-10-CM | POA: Diagnosis not present

## 2019-08-02 DIAGNOSIS — G4701 Insomnia due to medical condition: Secondary | ICD-10-CM | POA: Diagnosis not present

## 2019-08-02 DIAGNOSIS — Z5181 Encounter for therapeutic drug level monitoring: Secondary | ICD-10-CM | POA: Diagnosis not present

## 2019-08-02 DIAGNOSIS — G8929 Other chronic pain: Secondary | ICD-10-CM | POA: Diagnosis not present

## 2019-08-02 DIAGNOSIS — M5442 Lumbago with sciatica, left side: Secondary | ICD-10-CM | POA: Diagnosis not present

## 2019-08-30 DIAGNOSIS — M5442 Lumbago with sciatica, left side: Secondary | ICD-10-CM | POA: Diagnosis not present

## 2019-08-30 DIAGNOSIS — G8929 Other chronic pain: Secondary | ICD-10-CM | POA: Diagnosis not present

## 2019-10-20 DIAGNOSIS — M5442 Lumbago with sciatica, left side: Secondary | ICD-10-CM | POA: Diagnosis not present

## 2019-10-20 DIAGNOSIS — I10 Essential (primary) hypertension: Secondary | ICD-10-CM | POA: Diagnosis not present

## 2019-10-20 DIAGNOSIS — G8929 Other chronic pain: Secondary | ICD-10-CM | POA: Diagnosis not present

## 2019-10-20 DIAGNOSIS — M5441 Lumbago with sciatica, right side: Secondary | ICD-10-CM | POA: Diagnosis not present

## 2019-12-20 DIAGNOSIS — Z6827 Body mass index (BMI) 27.0-27.9, adult: Secondary | ICD-10-CM | POA: Diagnosis not present

## 2019-12-20 DIAGNOSIS — I10 Essential (primary) hypertension: Secondary | ICD-10-CM | POA: Diagnosis not present

## 2019-12-20 DIAGNOSIS — E782 Mixed hyperlipidemia: Secondary | ICD-10-CM | POA: Diagnosis not present

## 2019-12-20 DIAGNOSIS — Z0000 Encounter for general adult medical examination without abnormal findings: Secondary | ICD-10-CM | POA: Diagnosis not present

## 2019-12-20 DIAGNOSIS — Z1389 Encounter for screening for other disorder: Secondary | ICD-10-CM | POA: Diagnosis not present

## 2019-12-20 DIAGNOSIS — Z87891 Personal history of nicotine dependence: Secondary | ICD-10-CM | POA: Diagnosis not present

## 2020-02-19 DIAGNOSIS — M5442 Lumbago with sciatica, left side: Secondary | ICD-10-CM | POA: Diagnosis not present

## 2020-02-19 DIAGNOSIS — M5441 Lumbago with sciatica, right side: Secondary | ICD-10-CM | POA: Diagnosis not present

## 2020-02-19 DIAGNOSIS — I10 Essential (primary) hypertension: Secondary | ICD-10-CM | POA: Diagnosis not present

## 2020-02-19 DIAGNOSIS — G8929 Other chronic pain: Secondary | ICD-10-CM | POA: Diagnosis not present

## 2020-04-17 DIAGNOSIS — G4701 Insomnia due to medical condition: Secondary | ICD-10-CM | POA: Diagnosis not present

## 2020-04-17 DIAGNOSIS — G8194 Hemiplegia, unspecified affecting left nondominant side: Secondary | ICD-10-CM | POA: Diagnosis not present

## 2020-04-17 DIAGNOSIS — Z79899 Other long term (current) drug therapy: Secondary | ICD-10-CM | POA: Diagnosis not present

## 2020-04-17 DIAGNOSIS — M5441 Lumbago with sciatica, right side: Secondary | ICD-10-CM | POA: Diagnosis not present

## 2020-04-17 DIAGNOSIS — Z5181 Encounter for therapeutic drug level monitoring: Secondary | ICD-10-CM | POA: Diagnosis not present

## 2020-04-17 DIAGNOSIS — G8929 Other chronic pain: Secondary | ICD-10-CM | POA: Diagnosis not present

## 2020-04-17 DIAGNOSIS — I10 Essential (primary) hypertension: Secondary | ICD-10-CM | POA: Diagnosis not present

## 2020-04-17 DIAGNOSIS — R350 Frequency of micturition: Secondary | ICD-10-CM | POA: Diagnosis not present

## 2020-06-17 DIAGNOSIS — M5441 Lumbago with sciatica, right side: Secondary | ICD-10-CM | POA: Diagnosis not present

## 2020-06-17 DIAGNOSIS — M5442 Lumbago with sciatica, left side: Secondary | ICD-10-CM | POA: Diagnosis not present

## 2020-06-17 DIAGNOSIS — I10 Essential (primary) hypertension: Secondary | ICD-10-CM | POA: Diagnosis not present

## 2020-06-17 DIAGNOSIS — G8929 Other chronic pain: Secondary | ICD-10-CM | POA: Diagnosis not present

## 2020-06-17 DIAGNOSIS — R350 Frequency of micturition: Secondary | ICD-10-CM | POA: Diagnosis not present

## 2020-06-17 DIAGNOSIS — G8194 Hemiplegia, unspecified affecting left nondominant side: Secondary | ICD-10-CM | POA: Diagnosis not present

## 2020-06-18 DIAGNOSIS — E785 Hyperlipidemia, unspecified: Secondary | ICD-10-CM | POA: Diagnosis not present

## 2020-06-18 DIAGNOSIS — I10 Essential (primary) hypertension: Secondary | ICD-10-CM | POA: Diagnosis not present

## 2020-07-18 DIAGNOSIS — Z79899 Other long term (current) drug therapy: Secondary | ICD-10-CM | POA: Diagnosis not present

## 2020-07-18 DIAGNOSIS — R339 Retention of urine, unspecified: Secondary | ICD-10-CM | POA: Diagnosis not present

## 2020-07-18 DIAGNOSIS — N8111 Cystocele, midline: Secondary | ICD-10-CM | POA: Diagnosis not present

## 2020-07-18 DIAGNOSIS — N812 Incomplete uterovaginal prolapse: Secondary | ICD-10-CM | POA: Diagnosis not present

## 2020-07-18 DIAGNOSIS — N952 Postmenopausal atrophic vaginitis: Secondary | ICD-10-CM | POA: Diagnosis not present

## 2020-08-16 DIAGNOSIS — I10 Essential (primary) hypertension: Secondary | ICD-10-CM | POA: Diagnosis not present

## 2020-08-16 DIAGNOSIS — R000 Tachycardia, unspecified: Secondary | ICD-10-CM | POA: Diagnosis not present

## 2020-08-16 DIAGNOSIS — F17200 Nicotine dependence, unspecified, uncomplicated: Secondary | ICD-10-CM | POA: Diagnosis not present

## 2020-08-16 DIAGNOSIS — G8929 Other chronic pain: Secondary | ICD-10-CM | POA: Diagnosis not present

## 2020-08-16 DIAGNOSIS — M545 Low back pain, unspecified: Secondary | ICD-10-CM | POA: Diagnosis not present

## 2020-08-30 DIAGNOSIS — R21 Rash and other nonspecific skin eruption: Secondary | ICD-10-CM | POA: Diagnosis not present

## 2020-08-30 DIAGNOSIS — N179 Acute kidney failure, unspecified: Secondary | ICD-10-CM | POA: Diagnosis not present

## 2020-08-30 DIAGNOSIS — E860 Dehydration: Secondary | ICD-10-CM | POA: Diagnosis not present

## 2020-08-30 DIAGNOSIS — Z8673 Personal history of transient ischemic attack (TIA), and cerebral infarction without residual deficits: Secondary | ICD-10-CM | POA: Diagnosis not present

## 2020-08-30 DIAGNOSIS — I700 Atherosclerosis of aorta: Secondary | ICD-10-CM | POA: Diagnosis not present

## 2020-08-30 DIAGNOSIS — N170 Acute kidney failure with tubular necrosis: Secondary | ICD-10-CM | POA: Diagnosis not present

## 2020-08-30 DIAGNOSIS — E785 Hyperlipidemia, unspecified: Secondary | ICD-10-CM | POA: Diagnosis not present

## 2020-08-30 DIAGNOSIS — I10 Essential (primary) hypertension: Secondary | ICD-10-CM | POA: Diagnosis not present

## 2020-08-30 DIAGNOSIS — F17210 Nicotine dependence, cigarettes, uncomplicated: Secondary | ICD-10-CM | POA: Diagnosis not present

## 2020-08-30 DIAGNOSIS — N19 Unspecified kidney failure: Secondary | ICD-10-CM | POA: Diagnosis not present

## 2020-08-30 DIAGNOSIS — M545 Low back pain, unspecified: Secondary | ICD-10-CM | POA: Diagnosis not present

## 2020-08-30 DIAGNOSIS — R410 Disorientation, unspecified: Secondary | ICD-10-CM | POA: Diagnosis not present

## 2020-08-30 DIAGNOSIS — R4182 Altered mental status, unspecified: Secondary | ICD-10-CM | POA: Diagnosis not present

## 2020-08-30 DIAGNOSIS — G8929 Other chronic pain: Secondary | ICD-10-CM | POA: Diagnosis not present

## 2020-08-30 DIAGNOSIS — K449 Diaphragmatic hernia without obstruction or gangrene: Secondary | ICD-10-CM | POA: Diagnosis not present

## 2020-08-31 DIAGNOSIS — I10 Essential (primary) hypertension: Secondary | ICD-10-CM | POA: Diagnosis not present

## 2020-08-31 DIAGNOSIS — E860 Dehydration: Secondary | ICD-10-CM | POA: Diagnosis not present

## 2020-08-31 DIAGNOSIS — N179 Acute kidney failure, unspecified: Secondary | ICD-10-CM | POA: Diagnosis not present

## 2020-09-01 DIAGNOSIS — E860 Dehydration: Secondary | ICD-10-CM | POA: Diagnosis not present

## 2020-09-01 DIAGNOSIS — N179 Acute kidney failure, unspecified: Secondary | ICD-10-CM | POA: Diagnosis not present

## 2020-09-01 DIAGNOSIS — I10 Essential (primary) hypertension: Secondary | ICD-10-CM | POA: Diagnosis not present

## 2020-09-02 DIAGNOSIS — E860 Dehydration: Secondary | ICD-10-CM | POA: Diagnosis not present

## 2020-09-02 DIAGNOSIS — N179 Acute kidney failure, unspecified: Secondary | ICD-10-CM | POA: Diagnosis not present

## 2020-09-02 DIAGNOSIS — I10 Essential (primary) hypertension: Secondary | ICD-10-CM | POA: Diagnosis not present

## 2020-09-03 ENCOUNTER — Other Ambulatory Visit: Payer: Self-pay | Admitting: *Deleted

## 2020-09-03 NOTE — Patient Outreach (Signed)
[facility]) Care Management  09/03/2020  Ercilia Bettinger [birth year] [id number]  Transition of care referral   Referral Received : 09/03/20 Referral source: Discharge report  Date of Admission : 08/30/20 Date of Discharge: 09/02/20 Facility : [facility]  Insurance : [facility]    Outreach Attempt#1 Subjective: Outreach call to patient  no answer unable to leave a message.   Placed call to patient Alternate contact Nicki Guadalajara, number reached is not in service.   Plan Will send unsuccessful outreach letter and plan return call in the next 4 business days.    Egbert Garibaldi, RN, BSN  [facility] Care Management,Care Management Coordinator  [phone]- Mobile [phone]- Toll Free Main Office

## 2020-09-04 ENCOUNTER — Other Ambulatory Visit: Payer: Self-pay

## 2020-09-04 NOTE — Patient Outreach (Signed)
[facility]) Care Management  09/04/2020  Renette Hsu December 06, 1955 [id number]   Referral Received : 09/03/20 Referral source: Discharge report Date of Admission : 08/30/20 Date of Discharge: 09/02/20 Facility : [facility] Insurance : [facility]     Outreach Attempt: No answer. Unable to leave a message.  Plan: RN CM will attempt again within 4 business days.  Bary Leriche, RN, MSN [facility] Care Management Care Management Coordinator Direct Line [phone] Cell [phone] Toll Free: [phone]  Fax: [phone]

## 2020-09-05 ENCOUNTER — Other Ambulatory Visit: Payer: Self-pay

## 2020-09-05 NOTE — Patient Outreach (Signed)
[facility]) Care Management  09/05/2020  Yolanda Donaldson August 15, 1955 [id number]   Referral Received : 09/03/20 Referral source: Discharge report Date of Admission : 08/30/20 Date of Discharge: 09/02/20 Facility : [facility] Insurance : [facility]     Outreach Attempt: No answer. HIPAA compliant voice message left.     Plan: Patient will be re-attempted in the next 3 weeks.   Bary Leriche, RN, MSN [facility] Care Management Care Management Coordinator Direct Line [phone] Cell [phone] Toll Free: [phone]  Fax: [phone]

## 2020-09-24 ENCOUNTER — Other Ambulatory Visit: Payer: Self-pay

## 2020-09-24 NOTE — Patient Outreach (Signed)
[facility]) Care Management  09/24/2020  Yolanda Donaldson [birth year] [id number]   Telephone Assessment   Referral Received : 09/03/20 Referral source: Discharge report Date of Admission : 08/30/20 Date of Discharge: 09/02/20 Facility : [facility] Insurance : [facility]   Multiple attempts to establish contact with patient without success. No response from letter mailed to patient. Case is being closed at this time.    Plan: RN CM will close case at this time.      Antionette Fairy, RN,BSN,CCM [facility] Care Management Telephonic Care Management Coordinator Direct Phone: [phone] Toll Free: [phone] Fax: [phone]

## 2020-09-27 ENCOUNTER — Ambulatory Visit: Payer: Self-pay

## 2020-10-02 DIAGNOSIS — I10 Essential (primary) hypertension: Secondary | ICD-10-CM | POA: Diagnosis not present

## 2020-10-02 DIAGNOSIS — N8111 Cystocele, midline: Secondary | ICD-10-CM | POA: Diagnosis not present

## 2020-10-02 DIAGNOSIS — G459 Transient cerebral ischemic attack, unspecified: Secondary | ICD-10-CM | POA: Diagnosis not present

## 2020-10-03 DIAGNOSIS — I10 Essential (primary) hypertension: Secondary | ICD-10-CM | POA: Diagnosis not present

## 2020-10-03 DIAGNOSIS — G459 Transient cerebral ischemic attack, unspecified: Secondary | ICD-10-CM | POA: Diagnosis not present

## 2020-10-03 DIAGNOSIS — N8111 Cystocele, midline: Secondary | ICD-10-CM | POA: Diagnosis not present

## 2020-10-18 DIAGNOSIS — I10 Essential (primary) hypertension: Secondary | ICD-10-CM | POA: Diagnosis not present

## 2020-10-18 DIAGNOSIS — Z79899 Other long term (current) drug therapy: Secondary | ICD-10-CM | POA: Diagnosis not present

## 2020-10-18 DIAGNOSIS — F17200 Nicotine dependence, unspecified, uncomplicated: Secondary | ICD-10-CM | POA: Diagnosis not present

## 2020-10-18 DIAGNOSIS — Z5181 Encounter for therapeutic drug level monitoring: Secondary | ICD-10-CM | POA: Diagnosis not present

## 2020-10-18 DIAGNOSIS — R000 Tachycardia, unspecified: Secondary | ICD-10-CM | POA: Diagnosis not present

## 2020-10-18 DIAGNOSIS — M5440 Lumbago with sciatica, unspecified side: Secondary | ICD-10-CM | POA: Diagnosis not present

## 2020-10-18 DIAGNOSIS — G8929 Other chronic pain: Secondary | ICD-10-CM | POA: Diagnosis not present

## 2020-10-23 DIAGNOSIS — E871 Hypo-osmolality and hyponatremia: Secondary | ICD-10-CM | POA: Diagnosis not present

## 2020-10-23 DIAGNOSIS — R531 Weakness: Secondary | ICD-10-CM | POA: Diagnosis not present

## 2020-10-23 DIAGNOSIS — I10 Essential (primary) hypertension: Secondary | ICD-10-CM | POA: Diagnosis not present

## 2020-10-23 DIAGNOSIS — R079 Chest pain, unspecified: Secondary | ICD-10-CM | POA: Diagnosis not present

## 2020-10-23 DIAGNOSIS — R0689 Other abnormalities of breathing: Secondary | ICD-10-CM | POA: Diagnosis not present

## 2020-10-23 DIAGNOSIS — G8929 Other chronic pain: Secondary | ICD-10-CM | POA: Diagnosis not present

## 2020-10-23 DIAGNOSIS — G319 Degenerative disease of nervous system, unspecified: Secondary | ICD-10-CM | POA: Diagnosis not present

## 2020-10-23 DIAGNOSIS — G9389 Other specified disorders of brain: Secondary | ICD-10-CM | POA: Diagnosis not present

## 2020-10-23 DIAGNOSIS — N179 Acute kidney failure, unspecified: Secondary | ICD-10-CM | POA: Diagnosis not present

## 2020-10-23 DIAGNOSIS — E875 Hyperkalemia: Secondary | ICD-10-CM | POA: Diagnosis not present

## 2020-10-23 DIAGNOSIS — T40601A Poisoning by unspecified narcotics, accidental (unintentional), initial encounter: Secondary | ICD-10-CM | POA: Diagnosis not present

## 2020-10-23 DIAGNOSIS — R29898 Other symptoms and signs involving the musculoskeletal system: Secondary | ICD-10-CM | POA: Diagnosis not present

## 2020-10-23 DIAGNOSIS — G459 Transient cerebral ischemic attack, unspecified: Secondary | ICD-10-CM | POA: Diagnosis not present

## 2020-10-23 DIAGNOSIS — G9349 Other encephalopathy: Secondary | ICD-10-CM | POA: Diagnosis not present

## 2020-10-23 DIAGNOSIS — R4781 Slurred speech: Secondary | ICD-10-CM | POA: Diagnosis not present

## 2020-10-23 DIAGNOSIS — R0902 Hypoxemia: Secondary | ICD-10-CM | POA: Diagnosis not present

## 2020-10-23 DIAGNOSIS — R29700 NIHSS score 0: Secondary | ICD-10-CM | POA: Diagnosis not present

## 2020-10-23 DIAGNOSIS — R29701 NIHSS score 1: Secondary | ICD-10-CM | POA: Diagnosis not present

## 2020-10-23 DIAGNOSIS — I959 Hypotension, unspecified: Secondary | ICD-10-CM | POA: Diagnosis not present

## 2020-10-24 DIAGNOSIS — G459 Transient cerebral ischemic attack, unspecified: Secondary | ICD-10-CM | POA: Diagnosis not present

## 2020-10-25 ENCOUNTER — Other Ambulatory Visit: Payer: Self-pay

## 2020-10-25 NOTE — Patient Outreach (Signed)
[facility]) Care Management  10/25/2020  Yolanda Donaldson 05/10/1955 [id number]     Transition of Care Referral  Referral Date: 10/25/2020 Referral Source: [facility] Discharge Report Date of Discharge: 10/25/2020 Facility: [facility]    Unsuccessful outreach attempt # 1 to patient.    Plan: RN CM will make outreach attempt to patient within 3-4 business days.    Antionette Fairy, RN,BSN,CCM [facility] Care Management Telephonic Care Management Coordinator Direct Phone: [phone] Toll Free: [phone] Fax: [phone]

## 2020-10-29 ENCOUNTER — Other Ambulatory Visit: Payer: Self-pay

## 2020-10-29 NOTE — Patient Outreach (Signed)
[facility]) Care Management  10/29/2020  Talise Sligh July 19, 1955 [id number]   Transition of Care Referral   Referral Date: 10/25/2020 [facility] Date of Discharge: 10/25/2020 Facility: [facility]    Outreach attempt #2 to patient. Spoke with patient who reported she was resting and did not wish to talk at present.  Plan: RN CM will make outreach attempt to patient within 3-4 business days.    Antionette Fairy, RN,BSN,CCM [facility] Care Management Telephonic Care Management Coordinator Direct Phone: [phone] Toll Free: [phone] Fax: [phone]

## 2020-10-30 ENCOUNTER — Other Ambulatory Visit: Payer: Self-pay

## 2020-10-30 NOTE — Patient Outreach (Signed)
[facility]) Care Management  10/30/2020  Yolanda Donaldson 1955-09-13 [id number]   Transition of Care Referral   Referral Date: 10/25/2020 Referral Source: [facility] Discharge Report Date of Discharge: 10/25/2020 Facility: [facility]  Unsuccessful outreach attempt to patient.   Plan: RN CM will make outreach attempt to patient within 3-4 wks if no response from letter mailed to patient.  Antionette Fairy, RN,BSN,CCM [facility] Care Management Telephonic Care Management Coordinator Direct Phone: [phone] Toll Free: [phone] Fax: [phone]

## 2020-11-18 ENCOUNTER — Other Ambulatory Visit: Payer: Self-pay

## 2020-11-18 DIAGNOSIS — M5441 Lumbago with sciatica, right side: Secondary | ICD-10-CM | POA: Diagnosis not present

## 2020-11-18 DIAGNOSIS — R0989 Other specified symptoms and signs involving the circulatory and respiratory systems: Secondary | ICD-10-CM | POA: Diagnosis not present

## 2020-11-18 DIAGNOSIS — Z6821 Body mass index (BMI) 21.0-21.9, adult: Secondary | ICD-10-CM | POA: Diagnosis not present

## 2020-11-18 DIAGNOSIS — N179 Acute kidney failure, unspecified: Secondary | ICD-10-CM | POA: Diagnosis not present

## 2020-11-18 DIAGNOSIS — Z5181 Encounter for therapeutic drug level monitoring: Secondary | ICD-10-CM | POA: Diagnosis not present

## 2020-11-18 DIAGNOSIS — F17200 Nicotine dependence, unspecified, uncomplicated: Secondary | ICD-10-CM | POA: Diagnosis not present

## 2020-11-18 DIAGNOSIS — G8929 Other chronic pain: Secondary | ICD-10-CM | POA: Diagnosis not present

## 2020-11-18 DIAGNOSIS — Z79899 Other long term (current) drug therapy: Secondary | ICD-10-CM | POA: Diagnosis not present

## 2020-11-18 NOTE — Patient Outreach (Signed)
[facility]) Care Management  11/18/2020  Yolanda Donaldson [birth year] [id number]   Transition of Care Referral   Referral Date: 10/25/2020 Referral Source: [employer] Discharge Report Date of Discharge: 10/25/2020 Facility: [facility]   Multiple attempts to establish contact with patient without success. No response from letter mailed to patient. Case is being closed at this time.     Plan: RN CM will close case.   Antionette Fairy, RN,BSN,CCM [facility] Care Management Telephonic Care Management Coordinator Direct Phone: [phone] Toll Free: [phone] Fax: [phone]

## 2020-11-21 ENCOUNTER — Ambulatory Visit: Payer: Self-pay

## 2021-01-17 DIAGNOSIS — Z87891 Personal history of nicotine dependence: Secondary | ICD-10-CM | POA: Diagnosis not present

## 2021-01-17 DIAGNOSIS — Z0000 Encounter for general adult medical examination without abnormal findings: Secondary | ICD-10-CM | POA: Diagnosis not present

## 2021-01-17 DIAGNOSIS — E782 Mixed hyperlipidemia: Secondary | ICD-10-CM | POA: Diagnosis not present

## 2021-01-17 DIAGNOSIS — Z6828 Body mass index (BMI) 28.0-28.9, adult: Secondary | ICD-10-CM | POA: Diagnosis not present

## 2021-01-17 DIAGNOSIS — G8929 Other chronic pain: Secondary | ICD-10-CM | POA: Diagnosis not present

## 2021-01-17 DIAGNOSIS — I10 Essential (primary) hypertension: Secondary | ICD-10-CM | POA: Diagnosis not present

## 2021-01-17 DIAGNOSIS — Z1331 Encounter for screening for depression: Secondary | ICD-10-CM | POA: Diagnosis not present

## 2021-01-17 DIAGNOSIS — F3342 Major depressive disorder, recurrent, in full remission: Secondary | ICD-10-CM | POA: Diagnosis not present

## 2021-01-17 DIAGNOSIS — M545 Low back pain, unspecified: Secondary | ICD-10-CM | POA: Diagnosis not present

## 2021-02-26 DIAGNOSIS — R52 Pain, unspecified: Secondary | ICD-10-CM | POA: Diagnosis not present

## 2021-02-26 DIAGNOSIS — R059 Cough, unspecified: Secondary | ICD-10-CM | POA: Diagnosis not present

## 2021-02-26 DIAGNOSIS — R0981 Nasal congestion: Secondary | ICD-10-CM | POA: Diagnosis not present

## 2021-03-19 DIAGNOSIS — G8929 Other chronic pain: Secondary | ICD-10-CM | POA: Diagnosis not present

## 2021-03-19 DIAGNOSIS — I10 Essential (primary) hypertension: Secondary | ICD-10-CM | POA: Diagnosis not present

## 2021-03-19 DIAGNOSIS — F17200 Nicotine dependence, unspecified, uncomplicated: Secondary | ICD-10-CM | POA: Diagnosis not present

## 2021-03-19 DIAGNOSIS — M5441 Lumbago with sciatica, right side: Secondary | ICD-10-CM | POA: Diagnosis not present

## 2021-05-14 DIAGNOSIS — G8929 Other chronic pain: Secondary | ICD-10-CM | POA: Diagnosis not present

## 2021-05-14 DIAGNOSIS — M5442 Lumbago with sciatica, left side: Secondary | ICD-10-CM | POA: Diagnosis not present

## 2021-05-30 DIAGNOSIS — N814 Uterovaginal prolapse, unspecified: Secondary | ICD-10-CM | POA: Diagnosis not present

## 2021-05-30 DIAGNOSIS — R350 Frequency of micturition: Secondary | ICD-10-CM | POA: Diagnosis not present

## 2021-07-11 DIAGNOSIS — Z5181 Encounter for therapeutic drug level monitoring: Secondary | ICD-10-CM | POA: Diagnosis not present

## 2021-07-11 DIAGNOSIS — M5442 Lumbago with sciatica, left side: Secondary | ICD-10-CM | POA: Diagnosis not present

## 2021-07-11 DIAGNOSIS — I10 Essential (primary) hypertension: Secondary | ICD-10-CM | POA: Diagnosis not present

## 2021-07-11 DIAGNOSIS — Z79899 Other long term (current) drug therapy: Secondary | ICD-10-CM | POA: Diagnosis not present

## 2021-07-11 DIAGNOSIS — G8929 Other chronic pain: Secondary | ICD-10-CM | POA: Diagnosis not present

## 2021-07-14 DIAGNOSIS — N3946 Mixed incontinence: Secondary | ICD-10-CM | POA: Diagnosis not present

## 2021-07-14 DIAGNOSIS — N8111 Cystocele, midline: Secondary | ICD-10-CM | POA: Diagnosis not present

## 2021-07-15 ENCOUNTER — Other Ambulatory Visit: Payer: Self-pay | Admitting: Urology

## 2021-07-29 DIAGNOSIS — N3946 Mixed incontinence: Secondary | ICD-10-CM | POA: Diagnosis not present

## 2021-08-18 DIAGNOSIS — N8111 Cystocele, midline: Secondary | ICD-10-CM | POA: Diagnosis not present

## 2021-09-10 DIAGNOSIS — M5442 Lumbago with sciatica, left side: Secondary | ICD-10-CM | POA: Diagnosis not present

## 2021-09-10 DIAGNOSIS — I10 Essential (primary) hypertension: Secondary | ICD-10-CM | POA: Diagnosis not present

## 2021-09-10 DIAGNOSIS — G8929 Other chronic pain: Secondary | ICD-10-CM | POA: Diagnosis not present

## 2021-09-16 DIAGNOSIS — N8111 Cystocele, midline: Secondary | ICD-10-CM | POA: Diagnosis not present

## 2021-09-16 DIAGNOSIS — N3946 Mixed incontinence: Secondary | ICD-10-CM | POA: Diagnosis not present

## 2021-09-16 DIAGNOSIS — R8271 Bacteriuria: Secondary | ICD-10-CM | POA: Diagnosis not present

## 2021-09-16 NOTE — Patient Instructions (Addendum)
SURGICAL WAITING ROOM VISITATION Patients having surgery or a procedure may have no more than 2 support people in the waiting area - these visitors may rotate.   Children under the age of 98 must have an adult with them who is not the patient. If the patient needs to stay at the hospital during part of their recovery, the visitor guidelines for inpatient rooms apply. Pre-op nurse will coordinate an appropriate time for 1 support person to accompany patient in pre-op.  This support person may not rotate.    Please refer to the [facility] website for the visitor guidelines for Inpatients (after your surgery is over and you are in a regular room).       Your procedure is scheduled on:  10/01/21    Report to [facility] Main Entrance    Report to admitting at   [phone]   Call this number if you have problems the morning of surgery [phone] Drink clear liquids only all day the day before surgery.   Mix 1/2 bottle of Miralax in 32 ounces of Gatorade at 12 noon .  Drink 1 glass of this every 15-30 minutes until finished.  Take 2 tablets of colace with the Gatorade.  AT 600pm  mix 1/2 bottle of Miralax  in 32 ounces of Gatorade drink 1 glass of this every 15-30 minutes until finished.  Take 2 tablets of colace with the gatorade.     Do not eat food :After Midnight.   After Midnight you may have the following liquids until ____ 0430am  DAY OF SURGERY  Water Non-Citrus Juices (without pulp, NO RED) Carbonated Beverages Black Coffee (NO MILK/CREAM OR CREAMERS, sugar ok)  Clear Tea (NO MILK/CREAM OR CREAMERS, sugar ok) regular and decaf                             Plain Jell-O (NO RED)                                           Fruit ices (not with fruit pulp, NO RED)                                     Popsicles (NO RED)                                                               Sports drinks like Gatorade (NO RED)                    FOLLOW BOWEL PREP AND ANY ADDITIONAL PRE  OP INSTRUCTIONS YOU RECEIVED FROM YOUR SURGEON'S OFFICE!!!     Oral Hygiene is also important to reduce your risk of infection.                                    Remember - BRUSH YOUR TEETH THE MORNING OF SURGERY WITH YOUR REGULAR TOOTHPASTE   Do NOT smoke after Midnight   Take these medicines  the morning of surgery with A SIP OF WATER:   gabapentin, metoprolol     DO NOT TAKE ANY ORAL DIABETIC MEDICATIONS DAY OF YOUR SURGERY  Bring CPAP mask and tubing day of surgery.                              You may not have any metal on your body including hair pins, jewelry, and body piercing             Do not wear make-up, lotions, powders, perfumes/cologne, or deodorant  Do not wear nail polish including gel and S&S, artificial/acrylic nails, or any other type of covering on natural nails including finger and toenails. If you have artificial nails, gel coating, etc. that needs to be removed by a nail salon please have this removed prior to surgery or surgery may need to be canceled/ delayed if the surgeon/ anesthesia feels like they are unable to be safely monitored.   Do not shave  48 hours prior to surgery.               Men may shave face and neck.   Do not bring valuables to the hospital. [facility] IS NOT             RESPONSIBLE   FOR VALUABLES.   Contacts, dentures or bridgework may not be worn into surgery.   Bring small overnight bag day of surgery.   DO NOT BRING YOUR HOME MEDICATIONS TO THE HOSPITAL. PHARMACY WILL DISPENSE MEDICATIONS LISTED ON YOUR MEDICATION LIST TO YOU DURING YOUR ADMISSION IN THE HOSPITAL!    Patients discharged on the day of surgery will not be allowed to drive home.  Someone NEEDS to stay with you for the first 24 hours after anesthesia.   Special Instructions: Bring a copy of your healthcare power of attorney and living will documents         the day of surgery if you haven't scanned them before.              Please read over the following fact  sheets you were given: IF YOU HAVE QUESTIONS ABOUT YOUR PRE-OP INSTRUCTIONS PLEASE CALL [phone]     [facility] - Preparing for Surgery Before surgery, you can play an important role.  Because skin is not sterile, your skin needs to be as free of germs as possible.  You can reduce the number of germs on your skin by washing with CHG (chlorahexidine gluconate) soap before surgery.  CHG is an antiseptic cleaner which kills germs and bonds with the skin to continue killing germs even after washing. Please DO NOT use if you have an allergy to CHG or antibacterial soaps.  If your skin becomes reddened/irritated stop using the CHG and inform your nurse when you arrive at Short Stay. Do not shave (including legs and underarms) for at least 48 hours prior to the first CHG shower.  You may shave your face/neck. Please follow these instructions carefully:  1.  Shower with CHG Soap the night before surgery and the  morning of Surgery.  2.  If you choose to wash your hair, wash your hair first as usual with your  normal  shampoo.  3.  After you shampoo, rinse your hair and body thoroughly to remove the  shampoo.  4.  Use CHG as you would any other liquid soap.  You can apply chg directly  to the skin and wash                       Gently with a scrungie or clean washcloth.  5.  Apply the CHG Soap to your body ONLY FROM THE NECK DOWN.   Do not use on face/ open                           Wound or open sores. Avoid contact with eyes, ears mouth and genitals (private parts).                       Wash face,  Genitals (private parts) with your normal soap.             6.  Wash thoroughly, paying special attention to the area where your surgery  will be performed.  7.  Thoroughly rinse your body with warm water from the neck down.  8.  DO NOT shower/wash with your normal soap after using and rinsing off  the CHG Soap.                9.  Pat yourself dry with a clean towel.             10.  Wear clean pajamas.            11.  Place clean sheets on your bed the night of your first shower and do not  sleep with pets. Day of Surgery : Do not apply any lotions/deodorants the morning of surgery.  Please wear clean clothes to the hospital/surgery center.  FAILURE TO FOLLOW THESE INSTRUCTIONS MAY RESULT IN THE CANCELLATION OF YOUR SURGERY PATIENT SIGNATURE_________________________________  NURSE SIGNATURE__________________________________  ________________________________________________________________________

## 2021-09-16 NOTE — Progress Notes (Addendum)
Anesthesia Review:  PCP: DR Nelson Chimes  with [facility]  Cardiologist : none  Chest x-ray : EKG : 09/19/21  Echo : 11/18/20  Stress test: Cardiac Cath :  Activity level: can do a flight of stairs without difficulty  Sleep Study/ CPAP : none  Fasting Blood Sugar :      / Checks Blood Sugar -- times a day:   Blood Thinner/ Instructions /Last Dose: ASA / Instructions/ Last Dose :   Smoker PT with hx of stroke.  Daughter with pt at preop appt.  Alert andoriented x 3.  Answers questions appropriately.  Reports some left sided wieakness with hx of stroke and caroitd surgery.  PT was 30 minutes late for preop appt.   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

## 2021-09-19 ENCOUNTER — Encounter (HOSPITAL_COMMUNITY): Payer: Self-pay

## 2021-09-19 ENCOUNTER — Encounter (HOSPITAL_COMMUNITY)
Admission: RE | Admit: 2021-09-19 | Discharge: 2021-09-19 | Disposition: A | Discharge status: Home / Self Care | Payer: Medicare PPO | Source: Ambulatory Visit | Attending: Urology | Admitting: Urology

## 2021-09-19 ENCOUNTER — Other Ambulatory Visit: Payer: Self-pay

## 2021-09-19 VITALS — BP 123/54 | HR 69 | Temp 98.4°F | Resp 16 | Ht 65.0 in

## 2021-09-19 DIAGNOSIS — Z01818 Encounter for other preprocedural examination: Secondary | ICD-10-CM | POA: Insufficient documentation

## 2021-09-19 HISTORY — DX: Cerebral infarction, unspecified: I63.9

## 2021-09-19 LAB — CBC
HCT: 44.2 % (ref 36.0–46.0)
Hemoglobin: 14.3 g/dL (ref 12.0–15.0)
MCH: 29.3 pg (ref 26.0–34.0)
MCHC: 32.4 g/dL (ref 30.0–36.0)
MCV: 90.6 fL (ref 80.0–100.0)
Platelets: 232 10*3/uL (ref 150–400)
RBC: 4.88 MIL/uL (ref 3.87–5.11)
RDW: 14.7 % (ref 11.5–15.5)
WBC: 9 10*3/uL (ref 4.0–10.5)
nRBC: 0 % (ref 0.0–0.2)

## 2021-09-19 LAB — BASIC METABOLIC PANEL
Anion gap: 7 (ref 5–15)
BUN: 13 mg/dL (ref 8–23)
CO2: 25 mmol/L (ref 22–32)
Calcium: 9.3 mg/dL (ref 8.9–10.3)
Chloride: 105 mmol/L (ref 98–111)
Creatinine, Ser: 0.87 mg/dL (ref 0.44–1.00)
GFR, Estimated: >60 mL/min (ref >60)
Glucose, Bld: 142 mg/dL — ABNORMAL HIGH (ref 70–99)
Potassium: 4.1 mmol/L (ref 3.5–5.1)
Sodium: 137 mmol/L (ref 135–145)

## 2021-09-19 LAB — TYPE AND SCREEN
ABO/RH(D): O POS
Antibody Screen: NEGATIVE

## 2021-09-21 LAB — URINE CULTURE: Culture: NO GROWTH

## 2021-09-30 NOTE — H&P (Signed)
Pt presents today for pre-operative history and physical exam in anticipation of RAL sacrocolpopexy, supracervical hysterectomy, and bilateral salping-oophorectomy by Dr. Marlou Porch on 10/01/21. She is doing well and is without complaint.   Pt denies F/C, HA, CP, SOB, N/V, diarrhea/constipation, back pain, flank pain, hematuria, and dysuria.     HX:   66 year old otherwise reasonably healthy female presents today for consultation regarding pelvic organ prolapse and urinary incontinence.   The patient states that over the last several years she has noted that her bladder has fallen. In the mornings its not all the way out, but as the day progresses the vagina does appear to be outside the introitus. The patient states that she struggles to void and get her stream started as the prolapse gets worse throughout the day. She does not feel as if she empties her bladder well. She does state that she has a strong stream otherwise. The patient also complains of urinary urgency and urge associated incontinence. She also has episodes of stress incontinence with leakage with coughing, laughing, and sneezing. This has been ongoing.   The patient has no history of hematuria or dysuria. She has no history of recurrent urinary tract infections.   The patient has a uterus. She has never had an abnormal Pap smear.   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 She does not take any anticoagulants for that.   The patient has no other past surgical history.   UDS, 07/29/2021: Patient has minimal PVR. There is no evidence of stress urinary incontinence. Her maximum bladder capacity was 250 mL and there was no instability noted. She was able to generate a voluntary contraction, she did have some residual following this part of the procedure.   Interval: Today the patient is here for follow-up after completing  urodynamics. The patient denies any progressive voiding symptoms or pain.     ALLERGIES: No Known Allergies    MEDICATIONS: Metoprolol Succinate  Amlodipine Besilate  Atorvastatin Calcium  Meloxicam  Oxycodone-Acetaminophen     GU PSH: Complex cystometrogram, w/ void pressure and urethral pressure profile studies, any technique - 07/29/2021 Complex Uroflow - 07/29/2021 Emg surf Electrd - 07/29/2021 Inject For cystogram - 07/29/2021 Intrabd voidng Press - 07/29/2021       PSH Notes: Carotid endarterectomy 2005.   NON-GU PSH: None   GU PMH: Cystocele, midline - 08/18/2021, - 07/14/2021 Mixed incontinence - 07/29/2021, - 07/14/2021      PMH Notes: Kidney failure.  slipped disc lumbar spine   NON-GU PMH: Arthritis Cardiac murmur, unspecified Hypercholesterolemia Stroke/TIA    FAMILY HISTORY: None   SOCIAL HISTORY: Marital Status: Married Preferred Language: English; Ethnicity: Not Hispanic Or Latino; Race: White Current Smoking Status: Patient smokes.   Tobacco Use Assessment Completed: Used Tobacco in last 30 days? Does not use smokeless tobacco. Has never drank.  Does not use drugs. Drinks 4+ caffeinated drinks per day. Has not had a blood transfusion.     Notes: smokes 1 1/2 pack per day for 40 years     REVIEW OF SYSTEMS:    GU Review Female:   Patient denies frequent urination, hard to postpone urination, burning /pain with urination, get up at night to urinate, leakage of urine, stream starts and stops, trouble starting your stream, have to strain to urinate, and being pregnant.  Gastrointestinal (Upper):   Patient denies nausea, vomiting, and indigestion/ heartburn.  Gastrointestinal (Lower):  Patient denies diarrhea and constipation.  Constitutional:   Patient denies fever, night sweats, weight loss, and fatigue.  Skin:   Patient denies skin rash/ lesion and itching.  Eyes:   Patient denies double vision and blurred vision.  Ears/ Nose/ Throat:   Patient denies  sore throat and sinus problems.  Hematologic/Lymphatic:   Patient denies swollen glands and easy bruising.  Cardiovascular:   Patient denies leg swelling and chest pains.  Respiratory:   Patient denies cough and shortness of breath.  Endocrine:   Patient denies excessive thirst.  Musculoskeletal:   Patient denies back pain and joint pain.  Neurological:   Patient denies headaches and dizziness.  Psychologic:   Patient denies depression and anxiety.   VITAL SIGNS:      09/16/2021 01:48 PM  Weight 157 lb / 71.21 kg  Height 65 in / 165.1 cm  BP 151/63 mmHg  Pulse 99 /min  Temperature 97.3 F / 36.2 C  BMI 26.1 kg/m   MULTI-SYSTEM PHYSICAL EXAMINATION:    Constitutional: Well-nourished. No physical deformities. Normally developed. Good grooming.  Neck: Neck symmetrical, not swollen. Normal tracheal position. Loud carotid bruit on left. Mild bruit on right.   Respiratory: Normal breath sounds. Faint crackles at bases. No labored breathing, no use of accessory muscles.   Cardiovascular: Regular rate and rhythm. II/VI murmur, no gallop.   Lymphatic: No enlargement of neck, axillae, groin.  Skin: No paleness, no jaundice, no cyanosis. No lesion, no ulcer, no rash.  Neurologic / Psychiatric: Oriented to time, oriented to place, oriented to person. No depression, no anxiety, no agitation.  Gastrointestinal: No mass, no tenderness, no rigidity, non obese abdomen.  Eyes: Normal conjunctivae. Normal eyelids.  Ears, Nose, Mouth, and Throat: Left ear no scars, no lesions, no masses. Right ear no scars, no lesions, no masses. Nose no scars, no lesions, no masses. Normal hearing. Normal lips.  Musculoskeletal: Normal gait and station of head and neck.     Complexity of Data:  Records Review:   Previous Patient Records  Urine Test Review:   Urinalysis   09/16/21  Urinalysis  Urine Appearance Clear   Urine Color Yellow   Urine Glucose Neg mg/dL  Urine Bilirubin Neg mg/dL  Urine Ketones Neg  mg/dL  Urine Specific Gravity 1.010   Urine Blood Neg ery/uL  Urine pH 6.0   Urine Protein Neg mg/dL  Urine Urobilinogen 0.2 mg/dL  Urine Nitrites Neg   Urine Leukocyte Esterase 1+ leu/uL  Urine WBC/hpf 0 - 5/hpf   Urine RBC/hpf NS (Not Seen)   Urine Epithelial Cells 0 - 5/hpf   Urine Bacteria Rare (0-9/hpf)   Urine Mucous Not Present   Urine Yeast NS (Not Seen)   Urine Trichomonas Not Present   Urine Cystals NS (Not Seen)   Urine Casts NS (Not Seen)   Urine Sperm Not Present    PROCEDURES:          Urinalysis w/Scope Dipstick Dipstick Cont'd Micro  Color: Yellow Bilirubin: Neg mg/dL WBC/hpf: 0 - 5/hpf  Appearance: Clear Ketones: Neg mg/dL RBC/hpf: NS (Not Seen)  Specific Gravity: 1.010 Blood: Neg ery/uL Bacteria: Rare (0-9/hpf)  pH: 6.0 Protein: Neg mg/dL Cystals: NS (Not Seen)  Glucose: Neg mg/dL Urobilinogen: 0.2 mg/dL Casts: NS (Not Seen)    Nitrites: Neg Trichomonas: Not Present    Leukocyte Esterase: 1+ leu/uL Mucous: Not Present      Epithelial Cells: 0 - 5/hpf      Yeast: NS (Not Seen)  Sperm: Not Present    ASSESSMENT:      ICD-10 Details  1 GU:   Cystocele, midline - N81.11   2   Mixed incontinence - N39.46    PLAN:           Orders Labs Urine Culture          Schedule Return Visit/Planned Activity: Keep Scheduled Appointment - Schedule Surgery          Document Letter(s):  Created for Patient: Clinical Summary         Notes:   There are no changes in the patients history or physical exam since last evaluation by Dr. Marlou Porch. Pt is scheduled to undergo RAL sacrocolpopexy, supracervical hysterectomy, and bilateral salping-oophorectomy on 10/01/21.   Pt has loud carotid bruit on the left. Advised pt to call her PCP and have him order a carotid U/S to be done prior to her surgery if at all possible. She is asymptomatic but has not had a U/S in "several years" per her report and states that she knows she had a "45% blockage on that side" at the time of  her last U/S.   Bacteria on UA. Urine culture to r/o infection.   All pt's questions were answered to the best of my ability.

## 2021-10-01 ENCOUNTER — Other Ambulatory Visit: Payer: Self-pay

## 2021-10-01 ENCOUNTER — Ambulatory Visit (HOSPITAL_COMMUNITY): Payer: Medicare PPO | Admitting: Physician Assistant

## 2021-10-01 ENCOUNTER — Ambulatory Visit (HOSPITAL_BASED_OUTPATIENT_CLINIC_OR_DEPARTMENT_OTHER): Payer: Medicare PPO | Admitting: Certified Registered"

## 2021-10-01 ENCOUNTER — Observation Stay (HOSPITAL_COMMUNITY)
Admission: RE | Admit: 2021-10-01 | Discharge: 2021-10-02 | Disposition: A | Discharge status: Home / Self Care | Payer: Medicare PPO | Source: Ambulatory Visit | Attending: Urology | Admitting: Urology

## 2021-10-01 ENCOUNTER — Encounter (HOSPITAL_COMMUNITY): Payer: Self-pay | Admitting: Urology

## 2021-10-01 ENCOUNTER — Encounter (HOSPITAL_COMMUNITY)
Admission: RE | Disposition: A | Discharge status: Home / Self Care | Payer: Self-pay | Source: Ambulatory Visit | Attending: Urology

## 2021-10-01 DIAGNOSIS — N8189 Other female genital prolapse: Secondary | ICD-10-CM

## 2021-10-01 DIAGNOSIS — F17210 Nicotine dependence, cigarettes, uncomplicated: Secondary | ICD-10-CM | POA: Diagnosis not present

## 2021-10-01 DIAGNOSIS — N393 Stress incontinence (female) (male): Secondary | ICD-10-CM | POA: Diagnosis not present

## 2021-10-01 DIAGNOSIS — N819 Female genital prolapse, unspecified: Secondary | ICD-10-CM | POA: Diagnosis not present

## 2021-10-01 DIAGNOSIS — I10 Essential (primary) hypertension: Secondary | ICD-10-CM

## 2021-10-01 DIAGNOSIS — Z8673 Personal history of transient ischemic attack (TIA), and cerebral infarction without residual deficits: Secondary | ICD-10-CM | POA: Insufficient documentation

## 2021-10-01 DIAGNOSIS — N813 Complete uterovaginal prolapse: Secondary | ICD-10-CM

## 2021-10-01 DIAGNOSIS — N858 Other specified noninflammatory disorders of uterus: Secondary | ICD-10-CM | POA: Diagnosis not present

## 2021-10-01 DIAGNOSIS — Z01818 Encounter for other preprocedural examination: Secondary | ICD-10-CM

## 2021-10-01 DIAGNOSIS — N814 Uterovaginal prolapse, unspecified: Secondary | ICD-10-CM | POA: Diagnosis not present

## 2021-10-01 HISTORY — PX: ROBOTIC ASSISTED LAPAROSCOPIC SACROCOLPOPEXY: SHX5388

## 2021-10-01 HISTORY — PX: PUBOVAGINAL SLING: SHX1035

## 2021-10-01 LAB — ABO/RH: ABO/RH(D): O POS

## 2021-10-01 SURGERY — SACROCOLPOPEXY, ROBOT-ASSISTED, LAPAROSCOPIC
Anesthesia: General | Site: Pelvis

## 2021-10-01 MED ORDER — SODIUM CHLORIDE (PF) 0.9 % IJ SOLN
INTRAMUSCULAR | Status: AC
  Filled 2021-10-01: qty 20

## 2021-10-01 MED ORDER — PROPOFOL 10 MG/ML IV BOLUS
INTRAVENOUS | Status: AC
  Filled 2021-10-01: qty 20

## 2021-10-01 MED ORDER — DEXAMETHASONE SODIUM PHOSPHATE 10 MG/ML IJ SOLN
INTRAMUSCULAR | Status: DC | PRN
  Administered 2021-10-01: 8 mg via INTRAVENOUS

## 2021-10-01 MED ORDER — SODIUM CHLORIDE 0.45 % IV SOLN: INTRAVENOUS | Status: DC

## 2021-10-01 MED ORDER — TRAMADOL HCL 50 MG PO TABS: 50.0000 mg | ORAL_TABLET | Freq: Four times a day (QID) | ORAL | 0 refills | Status: DC | PRN

## 2021-10-01 MED ORDER — ROCURONIUM BROMIDE 10 MG/ML (PF) SYRINGE
PREFILLED_SYRINGE | INTRAVENOUS | Status: AC
  Filled 2021-10-01: qty 20

## 2021-10-01 MED ORDER — FENTANYL CITRATE PF 50 MCG/ML IJ SOSY
PREFILLED_SYRINGE | INTRAMUSCULAR | Status: AC
  Filled 2021-10-01: qty 2

## 2021-10-01 MED ORDER — ROCURONIUM BROMIDE 10 MG/ML (PF) SYRINGE
PREFILLED_SYRINGE | INTRAVENOUS | Status: AC
  Filled 2021-10-01: qty 10

## 2021-10-01 MED ORDER — PHENYLEPHRINE HCL (PRESSORS) 10 MG/ML IV SOLN
INTRAVENOUS | Status: AC
  Filled 2021-10-01: qty 1

## 2021-10-01 MED ORDER — LIDOCAINE-EPINEPHRINE 1 %-1:100000 IJ SOLN
INTRAMUSCULAR | Status: DC | PRN
  Administered 2021-10-01: 7 mL

## 2021-10-01 MED ORDER — ACETAMINOPHEN 10 MG/ML IV SOLN
1000.0000 mg | Freq: Once | INTRAVENOUS | Status: DC | PRN
Start: 2021-10-01 — End: 2021-10-01

## 2021-10-01 MED ORDER — ACETAMINOPHEN 160 MG/5ML PO SOLN: 1000.0000 mg | Freq: Once | ORAL | Status: DC | PRN

## 2021-10-01 MED ORDER — KETAMINE HCL 50 MG/5ML IJ SOSY
PREFILLED_SYRINGE | INTRAMUSCULAR | Status: AC
  Filled 2021-10-01: qty 5

## 2021-10-01 MED ORDER — LACTATED RINGERS IR SOLN
Status: DC | PRN
  Administered 2021-10-01: 1000 mL

## 2021-10-01 MED ORDER — GABAPENTIN 300 MG PO CAPS
300.0000 mg | ORAL_CAPSULE | Freq: Two times a day (BID) | ORAL | Status: DC
  Administered 2021-10-01 – 2021-10-02 (×2): 300 mg via ORAL
  Filled 2021-10-01 (×2): qty 1

## 2021-10-01 MED ORDER — AMLODIPINE BESYLATE 10 MG PO TABS
10.0000 mg | ORAL_TABLET | Freq: Every day | ORAL | Status: DC
  Administered 2021-10-01: 10 mg via ORAL
  Filled 2021-10-01: qty 1

## 2021-10-01 MED ORDER — LACTATED RINGERS IV SOLN: INTRAVENOUS | Status: DC | PRN

## 2021-10-01 MED ORDER — FENTANYL CITRATE PF 50 MCG/ML IJ SOSY
25.0000 ug | PREFILLED_SYRINGE | INTRAMUSCULAR | Status: DC | PRN
  Administered 2021-10-01: 50 ug via INTRAVENOUS
  Administered 2021-10-01 (×2): 25 ug via INTRAVENOUS

## 2021-10-01 MED ORDER — FENTANYL CITRATE (PF) 100 MCG/2ML IJ SOLN
INTRAMUSCULAR | Status: DC | PRN
  Administered 2021-10-01: 25 ug via INTRAVENOUS
  Administered 2021-10-01 (×3): 50 ug via INTRAVENOUS
  Administered 2021-10-01: 25 ug via INTRAVENOUS
  Administered 2021-10-01: 50 ug via INTRAVENOUS

## 2021-10-01 MED ORDER — 0.9 % SODIUM CHLORIDE (POUR BTL) OPTIME
TOPICAL | Status: DC | PRN
  Administered 2021-10-01: 1000 mL

## 2021-10-01 MED ORDER — ATORVASTATIN CALCIUM 40 MG PO TABS
40.0000 mg | ORAL_TABLET | Freq: Every day | ORAL | Status: DC
  Administered 2021-10-01: 40 mg via ORAL
  Filled 2021-10-01: qty 1

## 2021-10-01 MED ORDER — ACETAMINOPHEN 10 MG/ML IV SOLN
INTRAVENOUS | Status: DC | PRN
  Administered 2021-10-01: 1000 mg via INTRAVENOUS

## 2021-10-01 MED ORDER — ESTRADIOL 0.1 MG/GM VA CREA
TOPICAL_CREAM | VAGINAL | Status: AC
  Filled 2021-10-01: qty 42.5

## 2021-10-01 MED ORDER — MIDAZOLAM HCL 2 MG/2ML IJ SOLN
INTRAMUSCULAR | Status: AC
  Filled 2021-10-01: qty 2

## 2021-10-01 MED ORDER — BUPIVACAINE HCL (PF) 0.5 % IJ SOLN
INTRAMUSCULAR | Status: AC
Start: 2021-10-01 — End: ?
  Filled 2021-10-01: qty 30

## 2021-10-01 MED ORDER — DEXAMETHASONE SODIUM PHOSPHATE 10 MG/ML IJ SOLN
INTRAMUSCULAR | Status: AC
  Filled 2021-10-01: qty 1

## 2021-10-01 MED ORDER — CHLORHEXIDINE GLUCONATE 0.12 % MT SOLN
15.0000 mL | Freq: Once | OROMUCOSAL | Status: AC
  Administered 2021-10-01: 15 mL via OROMUCOSAL

## 2021-10-01 MED ORDER — ACETAMINOPHEN 500 MG PO TABS: 1000.0000 mg | ORAL_TABLET | Freq: Once | ORAL | Status: DC | PRN

## 2021-10-01 MED ORDER — ZOLPIDEM TARTRATE 5 MG PO TABS
5.0000 mg | ORAL_TABLET | Freq: Every evening | ORAL | Status: DC | PRN
  Administered 2021-10-02: 5 mg via ORAL
  Filled 2021-10-01: qty 1

## 2021-10-01 MED ORDER — ONDANSETRON HCL 4 MG/2ML IJ SOLN: 4.0000 mg | Freq: Four times a day (QID) | INTRAMUSCULAR | Status: DC | PRN

## 2021-10-01 MED ORDER — ONDANSETRON HCL 4 MG/2ML IJ SOLN
INTRAMUSCULAR | Status: AC
  Filled 2021-10-01: qty 2

## 2021-10-01 MED ORDER — ESTRADIOL 0.1 MG/GM VA CREA: 1.0000 | TOPICAL_CREAM | VAGINAL | 1 refills | Status: AC

## 2021-10-01 MED ORDER — KETOROLAC TROMETHAMINE 30 MG/ML IJ SOLN
INTRAMUSCULAR | Status: DC | PRN
  Administered 2021-10-01: 30 mg via INTRAVENOUS

## 2021-10-01 MED ORDER — FENTANYL CITRATE (PF) 250 MCG/5ML IJ SOLN
INTRAMUSCULAR | Status: AC
  Filled 2021-10-01: qty 5

## 2021-10-01 MED ORDER — OXYCODONE HCL 5 MG/5ML PO SOLN: 5.0000 mg | Freq: Once | ORAL | Status: DC | PRN

## 2021-10-01 MED ORDER — PROPOFOL 10 MG/ML IV BOLUS
INTRAVENOUS | Status: DC | PRN
  Administered 2021-10-01: 110 mg via INTRAVENOUS

## 2021-10-01 MED ORDER — BUPIVACAINE LIPOSOME 1.3 % IJ SUSP
INTRAMUSCULAR | Status: DC | PRN
  Administered 2021-10-01: 20 mL

## 2021-10-01 MED ORDER — LIDOCAINE-EPINEPHRINE 1 %-1:100000 IJ SOLN
INTRAMUSCULAR | Status: AC
  Filled 2021-10-01: qty 1

## 2021-10-01 MED ORDER — LIDOCAINE HCL (PF) 1 % IJ SOLN
INTRAMUSCULAR | Status: AC
  Filled 2021-10-01: qty 30

## 2021-10-01 MED ORDER — STERILE WATER FOR IRRIGATION IR SOLN
Status: DC | PRN
  Administered 2021-10-01: 1000 mL

## 2021-10-01 MED ORDER — PHENYLEPHRINE 80 MCG/ML (10ML) SYRINGE FOR IV PUSH (FOR BLOOD PRESSURE SUPPORT)
PREFILLED_SYRINGE | INTRAVENOUS | Status: DC | PRN
  Administered 2021-10-01: 160 ug via INTRAVENOUS

## 2021-10-01 MED ORDER — MIDAZOLAM HCL 5 MG/5ML IJ SOLN
INTRAMUSCULAR | Status: DC | PRN
  Administered 2021-10-01: 2 mg via INTRAVENOUS

## 2021-10-01 MED ORDER — BUPIVACAINE LIPOSOME 1.3 % IJ SUSP
INTRAMUSCULAR | Status: AC
Start: 2021-10-01 — End: ?
  Filled 2021-10-01: qty 20

## 2021-10-01 MED ORDER — CEFAZOLIN SODIUM-DEXTROSE 2-4 GM/100ML-% IV SOLN
2.0000 g | INTRAVENOUS | Status: AC
  Administered 2021-10-01: 2 g via INTRAVENOUS
  Filled 2021-10-01: qty 100

## 2021-10-01 MED ORDER — DOCUSATE SODIUM 100 MG PO CAPS: 100.0000 mg | ORAL_CAPSULE | Freq: Every day | ORAL | Status: DC | PRN

## 2021-10-01 MED ORDER — LIDOCAINE 2% (20 MG/ML) 5 ML SYRINGE
INTRAMUSCULAR | Status: DC | PRN
  Administered 2021-10-01: 60 mg via INTRAVENOUS

## 2021-10-01 MED ORDER — ACETAMINOPHEN 10 MG/ML IV SOLN
1000.0000 mg | Freq: Four times a day (QID) | INTRAVENOUS | Status: DC
  Administered 2021-10-01 – 2021-10-02 (×3): 1000 mg via INTRAVENOUS
  Filled 2021-10-01 (×3): qty 100

## 2021-10-01 MED ORDER — SULFAMETHOXAZOLE-TRIMETHOPRIM 800-160 MG PO TABS: 1.0000 | ORAL_TABLET | Freq: Two times a day (BID) | ORAL | 0 refills | Status: AC

## 2021-10-01 MED ORDER — DOCUSATE SODIUM 100 MG PO CAPS
100.0000 mg | ORAL_CAPSULE | Freq: Two times a day (BID) | ORAL | Status: DC
Start: 2021-10-01 — End: 2021-10-01

## 2021-10-01 MED ORDER — ORAL CARE MOUTH RINSE: 15.0000 mL | Freq: Once | OROMUCOSAL | Status: AC

## 2021-10-01 MED ORDER — TRAMADOL HCL 50 MG PO TABS
50.0000 mg | ORAL_TABLET | Freq: Four times a day (QID) | ORAL | Status: DC | PRN
  Administered 2021-10-01 (×2): 100 mg via ORAL
  Filled 2021-10-01 (×2): qty 2

## 2021-10-01 MED ORDER — METOPROLOL TARTRATE 25 MG PO TABS
25.0000 mg | ORAL_TABLET | Freq: Two times a day (BID) | ORAL | Status: DC
  Administered 2021-10-01 – 2021-10-02 (×2): 25 mg via ORAL
  Filled 2021-10-01 (×2): qty 1

## 2021-10-01 MED ORDER — BUPIVACAINE-EPINEPHRINE 0.5% -1:200000 IJ SOLN
INTRAMUSCULAR | Status: DC | PRN
  Administered 2021-10-01: 9 mL
  Administered 2021-10-01: 21 mL

## 2021-10-01 MED ORDER — SUCCINYLCHOLINE CHLORIDE 200 MG/10ML IV SOSY
PREFILLED_SYRINGE | INTRAVENOUS | Status: AC
  Filled 2021-10-01: qty 10

## 2021-10-01 MED ORDER — ROCURONIUM BROMIDE 10 MG/ML (PF) SYRINGE
PREFILLED_SYRINGE | INTRAVENOUS | Status: DC | PRN
  Administered 2021-10-01: 30 mg via INTRAVENOUS
  Administered 2021-10-01: 70 mg via INTRAVENOUS
  Administered 2021-10-01 (×2): 50 mg via INTRAVENOUS

## 2021-10-01 MED ORDER — LIDOCAINE 2% (20 MG/ML) 5 ML SYRINGE
INTRAMUSCULAR | Status: AC
  Filled 2021-10-01: qty 5

## 2021-10-01 MED ORDER — ACETAMINOPHEN 10 MG/ML IV SOLN
INTRAVENOUS | Status: AC
  Filled 2021-10-01: qty 100

## 2021-10-01 MED ORDER — ONDANSETRON HCL 4 MG/2ML IJ SOLN
INTRAMUSCULAR | Status: DC | PRN
  Administered 2021-10-01: 4 mg via INTRAVENOUS

## 2021-10-01 MED ORDER — LACTATED RINGERS IV SOLN: INTRAVENOUS | Status: DC

## 2021-10-01 MED ORDER — BUPIVACAINE LIPOSOME 1.3 % IJ SUSP
INTRAMUSCULAR | Status: AC
  Filled 2021-10-01: qty 20

## 2021-10-01 MED ORDER — KETOROLAC TROMETHAMINE 15 MG/ML IJ SOLN
15.0000 mg | Freq: Four times a day (QID) | INTRAMUSCULAR | Status: DC
  Administered 2021-10-01 – 2021-10-02 (×3): 15 mg via INTRAVENOUS
  Filled 2021-10-01 (×3): qty 1

## 2021-10-01 MED ORDER — ESTRADIOL 0.1 MG/GM VA CREA
TOPICAL_CREAM | VAGINAL | Status: DC | PRN
  Administered 2021-10-01: 1 via VAGINAL

## 2021-10-01 MED ORDER — HYDRALAZINE HCL 20 MG/ML IJ SOLN
5.0000 mg | INTRAMUSCULAR | Status: DC | PRN
  Administered 2021-10-02: 5 mg via INTRAVENOUS
  Filled 2021-10-01: qty 1

## 2021-10-01 MED ORDER — POLYETHYLENE GLYCOL 3350 17 GM/SCOOP PO POWD: 1.0000 | Freq: Once | ORAL | Status: DC

## 2021-10-01 MED ORDER — SUGAMMADEX SODIUM 200 MG/2ML IV SOLN
INTRAVENOUS | Status: DC | PRN
  Administered 2021-10-01: 300 mg via INTRAVENOUS

## 2021-10-01 MED ORDER — OXYCODONE HCL 5 MG PO TABS: 5.0000 mg | ORAL_TABLET | Freq: Once | ORAL | Status: DC | PRN

## 2021-10-01 SURGICAL SUPPLY — 100 items
ADH SKN CLS APL DERMABOND .7 (GAUZE/BANDAGES/DRESSINGS) ×2
APL PRP STRL LF DISP 70% ISPRP (MISCELLANEOUS) ×2
BAG COUNTER SPONGE SURGICOUNT (BAG) IMPLANT
BAG DRN RND TRDRP ANRFLXCHMBR (UROLOGICAL SUPPLIES)
BAG SPNG CNTER NS LX DISP (BAG) ×2
BAG URINE DRAIN 2000ML AR STRL (UROLOGICAL SUPPLIES) IMPLANT
BLADE HEX COATED 2.75 (ELECTRODE) ×3 IMPLANT
BLADE SURG 15 STRL LF DISP TIS (BLADE) ×6 IMPLANT
BLADE SURG 15 STRL SS (BLADE) ×4
CATH FOLEY 2WAY SLVR  5CC 16FR (CATHETERS) ×2
CATH FOLEY 2WAY SLVR 5CC 16FR (CATHETERS) ×3 IMPLANT
CHLORAPREP W/TINT 26 (MISCELLANEOUS) ×3 IMPLANT
CLIP LIGATING HEM O LOK PURPLE (MISCELLANEOUS) ×3 IMPLANT
CLIP LIGATING HEMO LOK XL GOLD (MISCELLANEOUS) IMPLANT
CLIP LIGATING HEMO O LOK GREEN (MISCELLANEOUS) IMPLANT
COUNTER NEEDLE 20 DBL MAG RED (NEEDLE) IMPLANT
COVER BACK TABLE 60X90IN (DRAPES) ×3 IMPLANT
COVER MAYO STAND STRL (DRAPES) IMPLANT
COVER SURGICAL LIGHT HANDLE (MISCELLANEOUS) ×3 IMPLANT
COVER TIP SHEARS 8 DVNC (MISCELLANEOUS) ×3 IMPLANT
COVER TIP SHEARS 8MM DA VINCI (MISCELLANEOUS) ×2
DERMABOND IMPLANT
DERMABOND ADVANCED (GAUZE/BANDAGES/DRESSINGS) ×2
DERMABOND ADVANCED .7 DNX12 (GAUZE/BANDAGES/DRESSINGS) ×3 IMPLANT
DRAIN CHANNEL RND F F (WOUND CARE) IMPLANT
DRAPE ARM DVNC X/XI (DISPOSABLE) ×12 IMPLANT
DRAPE COLUMN DVNC XI (DISPOSABLE) ×3 IMPLANT
DRAPE DA VINCI XI ARM (DISPOSABLE) ×8
DRAPE DA VINCI XI COLUMN (DISPOSABLE) ×2
DRAPE INCISE IOBAN 66X45 STRL (DRAPES) ×3 IMPLANT
DRAPE SHEET LG 3/4 BI-LAMINATE (DRAPES) ×6 IMPLANT
DRAPE SURG IRRIG POUCH 19X23 (DRAPES) ×3 IMPLANT
ELECT PENCIL ROCKER SW 15FT (MISCELLANEOUS) ×3 IMPLANT
ELECT REM PT RETURN 15FT ADLT (MISCELLANEOUS) ×3 IMPLANT
GAUZE 4X4 16PLY ~~LOC~~+RFID DBL (SPONGE) ×6 IMPLANT
GAUZE PACKING 2X5 YD STRL (GAUZE/BANDAGES/DRESSINGS) ×3 IMPLANT
GLOVE BIO SURGEON STRL SZ 6.5 (GLOVE) ×3 IMPLANT
GLOVE BIOGEL PI IND STRL 8 (GLOVE) ×3 IMPLANT
GLOVE BIOGEL PI INDICATOR 8 (GLOVE) ×2
GLOVE SURG LX 7.5 STRW (GLOVE) ×6
GLOVE SURG LX STRL 7.5 STRW (GLOVE) ×9 IMPLANT
GOWN SRG XL LVL 4 BRTHBL STRL (GOWNS) ×3 IMPLANT
GOWN STRL NON-REIN XL LVL4 (GOWNS) ×2
GOWN STRL REUS W/ TWL XL LVL3 (GOWN DISPOSABLE) ×6 IMPLANT
GOWN STRL REUS W/TWL XL LVL3 (GOWN DISPOSABLE) ×4
HOLDER FOLEY CATH W/STRAP (MISCELLANEOUS) ×3 IMPLANT
IRRIG SUCT STRYKERFLOW 2 WTIP (MISCELLANEOUS) ×2
IRRIGATION SUCT STRKRFLW 2 WTP (MISCELLANEOUS) ×3 IMPLANT
KIT BASIN OR (CUSTOM PROCEDURE TRAY) ×3 IMPLANT
KIT TURNOVER KIT A (KITS) IMPLANT
LYNX SYSTEM (Female Continence) IMPLANT
MANIPULATOR UTERINE 4.5 ZUMI (MISCELLANEOUS) IMPLANT
MARKER SKIN DUAL TIP RULER LAB (MISCELLANEOUS) ×3 IMPLANT
MESH Y UPSYLON VAGINAL (Mesh General) IMPLANT
NEEDLE HYPO 22GX1.5 SAFETY (NEEDLE) IMPLANT
NS IRRIG 1000ML POUR BTL (IV SOLUTION) ×3 IMPLANT
OCCLUDER COLPOPNEUMO (BALLOONS) IMPLANT
PACK CYSTO (CUSTOM PROCEDURE TRAY) ×3 IMPLANT
PACKING VAGINAL (PACKING) IMPLANT
PAD OB MATERNITY 4.3X12.25 (PERSONAL CARE ITEMS) IMPLANT
PAD POSITIONING PINK XL (MISCELLANEOUS) ×3 IMPLANT
PANTS MESH DISP LRG (UNDERPADS AND DIAPERS) IMPLANT
PLUG CATH AND CAP STER (CATHETERS) ×3 IMPLANT
RETRACTOR LONRSTAR 16.6X16.6CM (MISCELLANEOUS) ×3 IMPLANT
RETRACTOR RING URO 16.6X16.6 (MISCELLANEOUS) IMPLANT
RETRACTOR STAY HOOK 5MM (MISCELLANEOUS) ×3 IMPLANT
RETRACTOR STER APS 16.6X16.6CM (MISCELLANEOUS)
SEAL CANN UNIV 5-8 DVNC XI (MISCELLANEOUS) ×12 IMPLANT
SEAL XI 5MM-8MM UNIVERSAL (MISCELLANEOUS) ×8
SET IRRIG Y TYPE TUR BLADDER L (SET/KITS/TRAYS/PACK) IMPLANT
SET TUBE SMOKE EVAC HIGH FLOW (TUBING) ×3 IMPLANT
SHEET LAVH (DRAPES) ×3 IMPLANT
SLING LYNX SUPRAPUBIC (Sling) ×3 IMPLANT
SOL PREP POV-IOD 4OZ 10% (MISCELLANEOUS) IMPLANT
SOLUTION ELECTROLUBE (MISCELLANEOUS) ×3 IMPLANT
SPIKE FLUID TRANSFER (MISCELLANEOUS) ×3 IMPLANT
SURGILUBE 2OZ TUBE FLIPTOP (MISCELLANEOUS) IMPLANT
SUT MNCRL AB 4-0 PS2 18 (SUTURE) ×6 IMPLANT
SUT PROLENE 2 0 CT 1 (SUTURE) ×3 IMPLANT
SUT VIC AB 0 CT1 27 (SUTURE) ×4
SUT VIC AB 0 CT1 27XBRD ANTBC (SUTURE) ×3 IMPLANT
SUT VIC AB 2-0 SH 27 (SUTURE) ×12
SUT VIC AB 2-0 SH 27XBRD (SUTURE) ×15 IMPLANT
SUT VIC AB 2-0 UR6 27 (SUTURE) ×3 IMPLANT
SUT VIC AB 3-0 SH 27 (SUTURE) ×2
SUT VIC AB 3-0 SH 27X BRD (SUTURE) IMPLANT
SUT VICRYL 0 UR6 27IN ABS (SUTURE) ×3 IMPLANT
SUT VLOC 180 3-0 9IN GS21 (SUTURE) IMPLANT
SYR 10ML LL (SYRINGE) ×3 IMPLANT
SYR 50ML LL SCALE MARK (SYRINGE) IMPLANT
SYR BULB IRRIG 60ML STRL (SYRINGE) IMPLANT
SYS BAG RETRIEVAL 10MM (BASKET) ×2
SYSTEM BAG RETRIEVAL 10MM (BASKET) IMPLANT
TOWEL OR 17X26 10 PK STRL BLUE (TOWEL DISPOSABLE) ×3 IMPLANT
TRAY LAPAROSCOPIC (CUSTOM PROCEDURE TRAY) ×3 IMPLANT
TROCAR ADV FIXATION 12X100MM (TROCAR) ×3 IMPLANT
TROCAR Z-THREAD FIOS 5X100MM (TROCAR) ×3 IMPLANT
TUBING CONNECTING 10 (TUBING) ×3 IMPLANT
WATER STERILE IRR 1000ML POUR (IV SOLUTION) ×3 IMPLANT
WATER STERILE IRR 500ML POUR (IV SOLUTION) ×3 IMPLANT

## 2021-10-01 NOTE — Anesthesia Procedure Notes (Addendum)
Procedure Name: Intubation Date/Time: 10/01/2021 7:51 AM  Performed by: Cleda Daub, CRNAPre-anesthesia Checklist: Patient identified, Emergency Drugs available, Suction available and Patient being monitored Patient Re-evaluated:Patient Re-evaluated prior to induction Oxygen Delivery Method: Circle system utilized Preoxygenation: Pre-oxygenation with 100% oxygen Induction Type: IV induction Ventilation: Mask ventilation without difficulty and Oral airway inserted - appropriate to patient size Laryngoscope Size: Mac and 3 Grade View: Grade I Tube type: Oral Tube size: 7.0 mm Number of attempts: 1 Airway Equipment and Method: Stylet and Oral airway Placement Confirmation: ETT inserted through vocal cords under direct vision, positive ETCO2 and breath sounds checked- equal and bilateral Secured at: 21 cm Tube secured with: Tape Dental Injury: Injury to lip  Comments: A tiny chip on the right lower lip.  Lub applied.

## 2021-10-01 NOTE — Transfer of Care (Signed)
Immediate Anesthesia Transfer of Care Note  Patient: Yolanda Donaldson  Procedure(s) Performed: XI ROBOTIC ASSISTED LAPAROSCOPIC SACROCOLPOPEXY, SUPRACERVICAL HYSTERECTOMY, WITH BILATERAL SALPINGO-OOPHERECTOMY (Bilateral: Pelvis) CYSTOSCOPY AND MID-URETHRAL SLING (Bladder)  Patient Location: PACU  Anesthesia Type:General  Level of Consciousness: awake, alert , oriented and patient cooperative  Airway & Oxygen Therapy: Patient Spontanous Breathing and Patient connected to face mask oxygen  Post-op Assessment: Report given to RN and Post -op Vital signs reviewed and stable  Post vital signs: Reviewed and stable  Last Vitals:  Vitals Value Taken Time  BP 167/64 10/01/21 1215  Temp    Pulse 66 10/01/21 1216  Resp 24 10/01/21 1216  SpO2 100 % 10/01/21 1216  Vitals shown include unvalidated device data.  Last Pain:  Vitals:   10/01/21 0629  TempSrc: Oral  PainSc:       Patients Stated Pain Goal: 2 (10/01/21 7858)  Complications: No notable events documented.

## 2021-10-01 NOTE — Interval H&P Note (Signed)
History and Physical Interval Note:  10/01/2021 6:53 AM  Yolanda Donaldson  has presented today for surgery, with the diagnosis of PELVIC ORGAN PROLASPE/ STRESS URINARY INCONTINENCE.  The various methods of treatment have been discussed with the patient and family. After consideration of risks, benefits and other options for treatment, the patient has consented to  Procedure(s) with comments: XI ROBOTIC ASSISTED LAPAROSCOPIC SACROCOLPOPEXY, SUPRACERVICAL HYSTERECTOMY, WITH BILATERAL SALPINGO-OOPHERECTOMY (Bilateral) - 4.5 HOURS NEEDED CYSTOSCOPY AND MID-URETHRAL SLING (N/A) as a surgical intervention.  The patient's history has been reviewed, patient examined, no change in status, stable for surgery.  I have reviewed the patient's chart and labs.  Questions were answered to the patient's satisfaction.     Crist Fat

## 2021-10-01 NOTE — Discharge Instructions (Signed)

## 2021-10-01 NOTE — Anesthesia Preprocedure Evaluation (Addendum)
Anesthesia Evaluation  Patient identified by MRN, date of birth, ID band Patient awake    Reviewed: Allergy & Precautions, NPO status , Patient's Chart, lab work & pertinent test results  History of Anesthesia Complications Negative for: history of anesthetic complications  Airway Mallampati: III  TM Distance: >3 FB Neck ROM: Full    Dental  (+) Missing, Poor Dentition,    Pulmonary Current Smoker and Patient abstained from smoking.,    breath sounds clear to auscultation       Cardiovascular hypertension, Pt. on medications and Pt. on home beta blockers (-) angina(-) Past MI and (-) CHF (-) dysrhythmias (-) Valvular Problems/Murmurs Rhythm:Regular     Neuro/Psych neg Seizures Left sided weakness, 2009, right carotid stent CVA negative psych ROS   GI/Hepatic negative GI ROS, Neg liver ROS,   Endo/Other  negative endocrine ROS  Renal/GU negative Renal ROSLab Results      Component                Value               Date                      CREATININE               0.87                09/19/2021                Musculoskeletal negative musculoskeletal ROS (+)   Abdominal   Peds  Hematology negative hematology ROS (+) No results found for: "HGBA1C"    Anesthesia Other Findings   Reproductive/Obstetrics                            Anesthesia Physical Anesthesia Plan  ASA: 3  Anesthesia Plan: General   Post-op Pain Management: Toradol IV (intra-op)* and Ofirmev IV (intra-op)*   Induction: Intravenous  PONV Risk Score and Plan: 2 and Ondansetron and Dexamethasone  Airway Management Planned: Oral ETT  Additional Equipment: None  Intra-op Plan:   Post-operative Plan: Extubation in OR  Informed Consent: I have reviewed the patients History and Physical, chart, labs and discussed the procedure including the risks, benefits and alternatives for the proposed anesthesia with the patient  or authorized representative who has indicated his/her understanding and acceptance.     Dental advisory given  Plan Discussed with: CRNA  Anesthesia Plan Comments: (piv x 2)       Anesthesia Quick Evaluation

## 2021-10-01 NOTE — Op Note (Signed)
Preoperative diagnosis:   Pelvic organ prolapse Stress urinary incontinence  Postoperative diagnosis:   Same  Procedure: Robotic-assisted laparoscopic supracervical hysterectomy and  bilateral salpingo-oopherectomy Robotic-assisted laparoscopic sacrocolpopexy Cystoscopy Mid-urethral sling  Surgeon: Crist Fat, MD First assistant: Sharen Hint Am  Anesthesia: General  Complications: None  Intraoperative findings:   [employer] Upsilon Y mesh used for the sacrocolpopexy and lynx mesh mid-urethral sling  EBL: 20 mL  Specimens: Uterus and proximal cervix, bilateral fallopian tubes and ovaries  Indication: Yolanda Donaldson is a 66 y.o. female patient with symptomatic pelvic organ prolapse.    After reviewing the management options for treatment, she elected to proceed with the above surgical procedure(s). We have discussed the potential benefits and risks of the procedure, side effects of the proposed treatment, the likelihood of the patient achieving the goals of the procedure, and any potential problems that might occur during the procedure or recuperation. Informed consent has been obtained.  Description of procedure:  The patient was taken to the operating room and general anesthesia was induced.  The patient was placed in the dorsal lithotomy position, prepped and draped in the usual sterile fashion, and preoperative antibiotics were administered. A preoperative time-out was performed.    A Foley catheter was then placed and placed to gravity drainage. I then made a periumbilical incision carrying the dissection down to the patient's fascia with electrocautery.  Once to the fascia, the fascia was incised and a small puncture hole made in the peritoneum to allow passage of a 33mm port.   The abdomen was insufflated and the remaining ports placed under digital guidance.  2 ports were placed lateral to the umbilicus on the right proximally 10 cm apart.  The most lateral port was  approximately 3 cm above the anterior iliac spine.  2 additional ports were placed in the patient's right side in comparable positions to the most lateral port on the right was a 12 mm port.the robot was then docked at an angle from the leg obliquely along the side of the left leg.  We then began our surgery by cleaning up some of the pelvic adhesions to the small bowel and colon.  Once this was completed I started dissecting at the sacral promontory located 3 cm medial to the location where the ureter crosses over the right iliac vessels at the pelvic brim. The posterior peritoneum was incised and the sacral prominence cleared off an area taking care to avoid the middle sacral vessels and the iliac branches.  I then created a posterior peritoneal tunnel starting at the sacral promontory and tunneling down the right pelvic sidewall down into the pelvis breaking back through the posterior peritoneum around the vesico-vaginal junction posteriorly.  I then continued the posterior dissection retracting down on the rectum and finding the avascular plane between the posterior vaginal wall and the rectum.  I carried this dissection down as far as I could to along the area of the perineal body.  Then focused my attention to the uterus and hysterectomy.  I first started by taking the right round ligament with a series of by polar cautery.  I then dissected the anterior leaf of the broad ligament slightly more proximal and then distally down across the anterior mucosa to the salpinx and the internal cervical os.  I then took of the uterine ovarian ligament on the right and dissected free the right salpinx. Once the anterior leaf of the broad ligament had been completely dissected on the patient's right and  a small bladder flap had been created anteriorly attention was turned to the left side where a similar dissection was carried out. I then turned my attention to the anterior plane between the anterior vaginal wall and  the bladder.  I was able to obtain access to the avascular plane and with a combination of both monopolar cautery and blunt dissection was able to get down to the bladder neck.  I then turned my attention back to the patient's uterus and skeletonized the right uterine artery and vein and then took this with a series of bipolar moves.  I then performed a similar uterus pedicle ligation on the left.  At his point I was able to identify the patient's cervix and came through supracervical with monopolar cautery once the uterus was freed from all its attachments it was pushed into the left paracolic gutter and our attention was turned to placing the mesh.  Mesh was measured at approximately 7.5 cm anteriorly and 7.5 cm posteriorly and I cut this on the back table.  The mesh was then placed into the patient's abdomen through the assistant port and the anterior leaf was secured down onto the anterior vaginal wall with the apex at the bladder neck.  The posterior leaf was then secured down on the posterior vaginal wall.  These were sewn down with 2-0 Vicryl.  Between 6 and 8 were done on each side.  At this point I then went back to the previously dissected sacral promontory and posterior peritoneal tunnel and inserted a instrument through the tunnel and grasped the end of the mesh at the vaginal cuff and pull it up to the sacrum.  I then checked to ensure that the sacral mesh was not too tight by performing a vaginal exam.  I then secured the sacral leg of the mesh using a 0 Prolene.  I then reapproximated the posterior peritoneum with a 2-0 Vicryl in a running fashion around the sacral promontory.  The pelvic peritoneum was closed using a pursestring.  A small Endo Catch bag was then gently passed through the assistant port and the uterus was placed in the bag.  The bag was then brought out through the camera port once the trochars were removed.  We then made a slightly larger extraction incision to remove the uterus.   The fascia was then closed with 0 Vicryl in a figure-of-eight fashion.  The skin was closed with 4-0 Monocryl's.  Dermabond was applied to the incision and exparel injected into the incisions.  A 16 French Foley catheter was then placed in the patient's urethra and the bladder was drained. The Foley catheter was then capped. The horseshoe [place] retractor was then applied to the drape and the Foley catheter was attached to it. Using the skin hooks for skin nodes were placed in the corners of the labia minora to open up the vaginal vestibule. 5 cc of quarter percent Marcaine with 1% epinephrine was then injected into the periurethral tissue. The suprapubic incisions were then marked out 1 cm lateral to midline and 1 cm above the pubic bone. Using a 15 blade a stab incision was made in both sides of midline. Gauze is placed over these areas to control the skin bleeding. A 1.5 cm incision was then made in the mid urethra through the vaginal mucosa. Using this Lee scissors dissection was then carried out. Her urethra we laterally on both sides to the endopelvic fascia. The dissection was completed once there was enough  space to place a finger through the incision on both sides of the urethra. Using the [facility] needle set both needles were passed through the stab incisions suprapubically down posterior to the pubic bone and then through the periurethral incisions using the index finger to guide the needle out of the incision. Once both needles had been placed the Foley catheter was removed and cystoscopy was performed. A 70 lens was passed gently through the patient's urethra and into the bladder under visual guidance. A 360 cystoscopic evaluation was performed. There was no mucosal abnormalities or evidence of perforation from the needles. The cystoscope was then removed and the Foley catheter replaced. The bladder was then drained again and the Foley catheter. The ends of the sling were then  attached to the needles and the needles pulled up through the retropubic space and out the suprapubic incision. Once the sling was noted to be centered, the blue plastic cap at the apex of the sling was cut and the plastic sheath was then pulled out. The mesh was noted to be well seated around the urethra. A right angle was used to ensure that the proper amount of tension for the sling around the urethra applied. The sling was noted to be tension free and well positioned. At this point copious amounts of double antibiotic irrigation was then used to irrigate the incision the periurethral space and the vagina. The incision was then closed with 2-0 Vicryl in a running fashion. The stab incisions in the suprapubic area were closed with Dermabond.   Estrace impregnated packing was then placed into her vagina which will be left in overnight.  The patient was subsequently extubated and returned to the PACU in excellent condition.

## 2021-10-02 ENCOUNTER — Encounter (HOSPITAL_COMMUNITY): Payer: Self-pay | Admitting: Urology

## 2021-10-02 DIAGNOSIS — N819 Female genital prolapse, unspecified: Secondary | ICD-10-CM | POA: Diagnosis not present

## 2021-10-02 DIAGNOSIS — N393 Stress incontinence (female) (male): Secondary | ICD-10-CM | POA: Diagnosis not present

## 2021-10-02 DIAGNOSIS — Z8673 Personal history of transient ischemic attack (TIA), and cerebral infarction without residual deficits: Secondary | ICD-10-CM | POA: Diagnosis not present

## 2021-10-02 NOTE — Progress Notes (Signed)
Discharge instructions discussed with patient and family, verbalized agreement and understanding 

## 2021-10-02 NOTE — Anesthesia Postprocedure Evaluation (Signed)
Anesthesia Post Note  Patient: Yolanda Donaldson  Procedure(s) Performed: XI ROBOTIC ASSISTED LAPAROSCOPIC SACROCOLPOPEXY, SUPRACERVICAL HYSTERECTOMY, WITH BILATERAL SALPINGO-OOPHERECTOMY (Bilateral: Pelvis) CYSTOSCOPY AND MID-URETHRAL SLING (Bladder)     Patient location during evaluation: PACU Anesthesia Type: General Level of consciousness: awake and alert Pain management: pain level controlled Vital Signs Assessment: post-procedure vital signs reviewed and stable Respiratory status: spontaneous breathing, nonlabored ventilation, respiratory function stable and patient connected to nasal cannula oxygen Cardiovascular status: blood pressure returned to baseline and stable Postop Assessment: no apparent nausea or vomiting Anesthetic complications: no   No notable events documented.  Last Vitals:  Vitals:   10/02/21 0544 10/02/21 0922  BP: (!) 141/50 (!) 127/49  Pulse: 66 70  Resp: 18 14  Temp: 36.8 C 36.6 C  SpO2: 96% 96%    Last Pain:  Vitals:   10/02/21 0922  TempSrc: Oral  PainSc:                  Dreyah Montrose

## 2021-10-02 NOTE — Discharge Summary (Signed)
Date of admission: 10/01/2021  Date of discharge: 10/02/2021  Admission diagnosis: pelvic organ prolapse, stress urinary incontinence  Discharge diagnosis: same  Secondary diagnoses:  Patient Active Problem List   Diagnosis Date Noted   Prolapse of female pelvic organs 10/01/2021    Procedures performed: Procedure(s): XI ROBOTIC ASSISTED LAPAROSCOPIC SACROCOLPOPEXY, SUPRACERVICAL HYSTERECTOMY, WITH BILATERAL SALPINGO-OOPHERECTOMY CYSTOSCOPY AND MID-URETHRAL SLING  History and Physical: For full details, please see admission history and physical. Briefly, Yolanda Donaldson is a 66 y.o. year old patient with pelvic organ prolapse and stress urinary incontinence.   PE on day of discharge: NAD Vitals:   10/02/21 0102 10/02/21 0109 10/02/21 0210 10/02/21 0544  BP: (!) 163/64 (!) 149/59 (!) 141/62 (!) 141/50  Pulse: 71 69 72 66  Resp: 18 18 18 18   Temp: 98.2 F (36.8 C) 98.2 F (36.8 C) 97.9 F (36.6 C) 98.2 F (36.8 C)  TempSrc: Oral Oral Oral Oral  SpO2: 93% 95% 97% 96%  Weight:      Height:        Intake/Output Summary (Last 24 hours) at 10/02/2021 0731 Last data filed at 10/02/2021 0600 Gross per 24 hour  Intake 4048.18 ml  Output 5050 ml  Net -1001.82 ml   Non-labored breathing Abdomen is soft, incisions c/d/I Catheter out Ext symmetric  No new labs  Hospital Course: Patient tolerated the procedure well.  She was then transferred to the floor after an uneventful PACU stay.  Her hospital course was uncomplicated.  On POD#1 she had met discharge criteria: was eating a regular diet, was up and ambulating independently,  pain was well controlled, was voiding without a catheter, and was ready to for discharge.   Laboratory values:  No results for input(s): "WBC", "HGB", "HCT" in the last 72 hours. No results for input(s): "NA", "K", "CL", "CO2", "GLUCOSE", "BUN", "CREATININE", "CALCIUM" in the last 72 hours. No results for input(s): "LABPT", "INR" in the last 72  hours. No results for input(s): "LABURIN" in the last 72 hours. Results for orders placed or performed during the hospital encounter of 09/19/21  Urine Culture     Status: None   Collection Time: 09/19/21  1:23 PM   Specimen: Urine, Clean Catch  Result Value Ref Range Status   Specimen Description   Final    URINE, CLEAN CATCH Performed at [facility], [place] [address] [place], [place] [address]    Special Requests   Final    NONE Performed at [facility], [place] [address], [place], [place] [address]    Culture   Final    NO GROWTH Performed at [facility], [place] [address], [place], [place] [address]    Report Status 09/21/2021 FINAL  Final    Disposition: Home  Discharge instruction: The patient was instructed to be ambulatory but told to refrain from heavy lifting, strenuous activity, or driving.   Discharge medications:  Allergies as of 10/02/2021   No Known Allergies      Medication List     TAKE these medications    amLODipine 10 MG tablet Commonly known as: NORVASC Take 10 mg by mouth at bedtime.   aspirin EC 81 MG tablet Take 81 mg by mouth daily.   atorvastatin 40 MG tablet Commonly known as: LIPITOR Take 40 mg by mouth at bedtime.   docusate sodium 100 MG capsule Commonly known as: COLACE Take 100 mg by mouth daily as needed for mild constipation.   estradiol 0.1 MG/GM vaginal  cream Commonly known as: ESTRACE VAGINAL Place 1 Applicatorful vaginally 3 (three) times a week. Use 1 small dolyp of cream on tip of index finger and swap the inside of the vagina   gabapentin 300 MG capsule Commonly known as: NEURONTIN Take 300 mg by mouth in the morning and at bedtime.   meloxicam 7.5 MG tablet Commonly known as: MOBIC Take 7.5 mg by mouth daily.   metoprolol tartrate 25 MG tablet Commonly known as: LOPRESSOR Take 25 mg by mouth 2 (two) times daily.   sulfamethoxazole-trimethoprim 800-160 MG  tablet Commonly known as: BACTRIM DS Take 1 tablet by mouth 2 (two) times daily.   traMADol 50 MG tablet Commonly known as: Ultram Take 1-2 tablets (50-100 mg total) by mouth every 6 (six) hours as needed for moderate pain.        Followup:   Follow-up Information     Karen Kays, NP Follow up on 10/16/2021.   Specialty: Nurse Practitioner Why: 12:45p Contact information: [address] [place] [address] [phone]

## 2021-10-06 LAB — SURGICAL PATHOLOGY

## 2021-10-22 DIAGNOSIS — N390 Urinary tract infection, site not specified: Secondary | ICD-10-CM | POA: Diagnosis not present

## 2021-10-22 DIAGNOSIS — N3090 Cystitis, unspecified without hematuria: Secondary | ICD-10-CM | POA: Diagnosis not present

## 2021-10-22 DIAGNOSIS — R339 Retention of urine, unspecified: Secondary | ICD-10-CM | POA: Diagnosis not present

## 2021-10-24 DIAGNOSIS — Z466 Encounter for fitting and adjustment of urinary device: Secondary | ICD-10-CM | POA: Diagnosis not present

## 2021-10-31 ENCOUNTER — Ambulatory Visit: Payer: Medicare HMO | Admitting: Obstetrics and Gynecology

## 2021-11-07 DIAGNOSIS — E782 Mixed hyperlipidemia: Secondary | ICD-10-CM | POA: Diagnosis not present

## 2021-11-07 DIAGNOSIS — G8929 Other chronic pain: Secondary | ICD-10-CM | POA: Diagnosis not present

## 2021-11-07 DIAGNOSIS — M5442 Lumbago with sciatica, left side: Secondary | ICD-10-CM | POA: Diagnosis not present

## 2021-11-07 DIAGNOSIS — M5441 Lumbago with sciatica, right side: Secondary | ICD-10-CM | POA: Diagnosis not present

## 2021-11-07 DIAGNOSIS — I10 Essential (primary) hypertension: Secondary | ICD-10-CM | POA: Diagnosis not present

## 2022-01-07 ENCOUNTER — Ambulatory Visit (INDEPENDENT_AMBULATORY_CARE_PROVIDER_SITE_OTHER): Payer: Medicare PPO | Admitting: Internal Medicine

## 2022-01-07 VITALS — BP 124/62 | HR 79 | Temp 97.6°F | Resp 18 | Ht 63.0 in | Wt 162.0 lb

## 2022-01-07 DIAGNOSIS — G8929 Other chronic pain: Secondary | ICD-10-CM

## 2022-01-07 DIAGNOSIS — M5442 Lumbago with sciatica, left side: Secondary | ICD-10-CM | POA: Diagnosis not present

## 2022-01-07 DIAGNOSIS — E785 Hyperlipidemia, unspecified: Secondary | ICD-10-CM | POA: Diagnosis not present

## 2022-01-07 DIAGNOSIS — I10 Essential (primary) hypertension: Secondary | ICD-10-CM

## 2022-01-07 DIAGNOSIS — T402X5A Adverse effect of other opioids, initial encounter: Secondary | ICD-10-CM

## 2022-01-07 DIAGNOSIS — K5903 Drug induced constipation: Secondary | ICD-10-CM

## 2022-01-07 MED ORDER — OXYCODONE-ACETAMINOPHEN 10-325 MG PO TABS: 1.0000 | ORAL_TABLET | Freq: Three times a day (TID) | ORAL | 0 refills | Status: DC | PRN

## 2022-01-07 MED ORDER — LINACLOTIDE 145 MCG PO CAPS: 145.0000 ug | ORAL_CAPSULE | Freq: Every day | ORAL | 5 refills | Status: DC

## 2022-01-07 NOTE — Progress Notes (Signed)
   Established Patient Office Visit  Subjective   Patient ID: Yolanda Donaldson, female    DOB: [birth year]  Age: 66 y.o. MRN: [id number]  Chief Complaint  Patient presents with   Pain Management    Pain F/U    Back Pain This is a chronic problem. The current episode started more than 1 year ago. The problem occurs constantly. The problem is unchanged. The pain is present in the lumbar spine. The quality of the pain is described as aching. The pain radiates to the left thigh. The pain is at a severity of 5/10. The pain is moderate. The symptoms are aggravated by bending, standing and sitting. Stiffness is present In the morning. Associated symptoms include paresthesias. Pertinent negatives include no bladder incontinence, bowel incontinence or chest pain.  Hypertension This is a chronic problem. The current episode started more than 1 year ago. The problem is controlled. Pertinent negatives include no anxiety, chest pain or shortness of breath. Past treatments include ACE inhibitors and calcium channel blockers. There are no compliance problems.   She also has dyslipidemia and she is taking her cholesterol medicine without any side effects.   She also admit that she has constipations and ran out of linzess that helped before.   Review of Systems  Respiratory:  Negative for shortness of breath.   Cardiovascular:  Negative for chest pain.  Gastrointestinal:  Positive for constipation. Negative for bowel incontinence.  Genitourinary:  Negative for bladder incontinence.  Musculoskeletal:  Positive for back pain.  Neurological:  Positive for paresthesias.      Objective:     BP 124/62 (BP Location: Left Arm, Patient Position: Sitting, Cuff Size: Normal)   Pulse 79   Temp 97.6 F (36.4 C) (Temporal)   Resp 18   Ht 5\' 3"  (1.6 m)   Wt 162 lb (73.5 kg)   SpO2 99%   BMI 28.70 kg/m    Physical Exam Constitutional:      Appearance: Normal appearance.  HENT:     Head: Normocephalic  and atraumatic.  Cardiovascular:     Rate and Rhythm: Normal rate and regular rhythm.     Heart sounds: Normal heart sounds.  Pulmonary:     Effort: Pulmonary effort is normal.     Breath sounds: Normal breath sounds.  Neurological:     General: No focal deficit present.     Mental Status: She is alert and oriented to person, place, and time.      No results found for any visits on 01/07/22.   The ASCVD Risk score (Arnett DK, et al., 2019) failed to calculate for the following reasons:   The patient has a prior MI or stroke diagnosis    Assessment & Plan:   Problem List Items Addressed This Visit   None   No follow-ups on file.    2020, MD

## 2022-01-08 LAB — CMP14 + ANION GAP
ALT: 6 IU/L (ref 0–32)
AST: 10 IU/L (ref 0–40)
Albumin/Globulin Ratio: 1.8 (ref 1.2–2.2)
Albumin: 4.5 g/dL (ref 3.9–4.9)
Alkaline Phosphatase: 155 IU/L — ABNORMAL HIGH (ref 44–121)
Anion Gap: 16 mmol/L (ref 10.0–18.0)
BUN/Creatinine Ratio: 19 (ref 12–28)
BUN: 16 mg/dL (ref 8–27)
Bilirubin Total: 0.3 mg/dL (ref 0.0–1.2)
CO2: 22 mmol/L (ref 20–29)
Calcium: 9.6 mg/dL (ref 8.7–10.3)
Chloride: 104 mmol/L (ref 96–106)
Creatinine, Ser: 0.84 mg/dL (ref 0.57–1.00)
Globulin, Total: 2.5 g/dL (ref 1.5–4.5)
Glucose: 116 mg/dL — ABNORMAL HIGH (ref 70–99)
Potassium: 4.5 mmol/L (ref 3.5–5.2)
Sodium: 142 mmol/L (ref 134–144)
Total Protein: 7 g/dL (ref 6.0–8.5)
eGFR: 76 mL/min/{1.73_m2} (ref >59)

## 2022-01-08 LAB — LIPID PANEL
Chol/HDL Ratio: 3.3 ratio (ref 0.0–4.4)
Cholesterol, Total: 156 mg/dL (ref 100–199)
HDL: 47 mg/dL (ref >39)
LDL Chol Calc (NIH): 89 mg/dL (ref 0–99)
Triglycerides: 111 mg/dL (ref 0–149)
VLDL Cholesterol Cal: 20 mg/dL (ref 5–40)

## 2022-01-14 ENCOUNTER — Other Ambulatory Visit: Payer: Self-pay | Admitting: Internal Medicine

## 2022-01-14 MED ORDER — OXYCODONE-ACETAMINOPHEN 10-325 MG PO TABS: 1.0000 | ORAL_TABLET | Freq: Three times a day (TID) | ORAL | 0 refills | Status: DC | PRN

## 2022-01-16 ENCOUNTER — Other Ambulatory Visit: Payer: Self-pay

## 2022-01-16 MED ORDER — METOPROLOL TARTRATE 25 MG PO TABS: 25.0000 mg | ORAL_TABLET | Freq: Two times a day (BID) | ORAL | 0 refills | Status: DC

## 2022-02-04 ENCOUNTER — Telehealth: Payer: Self-pay | Admitting: Internal Medicine

## 2022-02-04 NOTE — Telephone Encounter (Signed)
She called and would like her pain medication refilled January 1. Our computer says December 6

## 2022-03-09 ENCOUNTER — Ambulatory Visit: Payer: Medicare PPO | Admitting: Internal Medicine

## 2022-03-09 ENCOUNTER — Encounter: Payer: Self-pay | Admitting: Internal Medicine

## 2022-03-09 VITALS — BP 120/60 | HR 71 | Temp 97.8°F | Resp 18 | Ht 63.0 in | Wt 160.2 lb

## 2022-03-09 DIAGNOSIS — M5442 Lumbago with sciatica, left side: Secondary | ICD-10-CM | POA: Diagnosis not present

## 2022-03-09 DIAGNOSIS — E785 Hyperlipidemia, unspecified: Secondary | ICD-10-CM | POA: Diagnosis not present

## 2022-03-09 DIAGNOSIS — G8929 Other chronic pain: Secondary | ICD-10-CM

## 2022-03-09 DIAGNOSIS — I10 Essential (primary) hypertension: Secondary | ICD-10-CM | POA: Diagnosis not present

## 2022-03-09 MED ORDER — OXYCODONE-ACETAMINOPHEN 10-325 MG PO TABS: 1.0000 | ORAL_TABLET | Freq: Three times a day (TID) | ORAL | 0 refills | Status: DC | PRN

## 2022-03-09 NOTE — Assessment & Plan Note (Signed)
Will continue with same dse and will do lipid panel.

## 2022-03-09 NOTE — Progress Notes (Signed)
Office Visit  Subjective   Patient ID: Yolanda Donaldson   DOB: [birth year]   Age: 67 y.o.   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 She She says her bladder issue came back and she wanted to go back to see her urologist in [place].   She says that she know that her back pain will stay that way and as long as she take pain medications. She can can carry out her life. Her pain radiate to left leg most often. She has numbness in her toes. She describes the pain as moderate in severity, and radiating into the right leg and left leg. The onset of the back pain was gradual and began without a clear precipitating event.  The pain is aggravated by prolonged standing, sitting, and walking The pain is alleviated by with pain medications She states that the pain does not wake her from sleep and the pain is better in the morning. She has no additional complaints. She denies bilateral paresthesias and bilateral leg weakness. Her past medical history is notable for Benign Essential Hypertension, Cerebrovascular Accident (CVA), and Hyperlipidemia.  The patient is a 67 year old Caucasian/White female who presents for a follow-up evaluation of hypertension. The patient has not been checking her blood pressure at home. The patient's current medications include: AMLODIPINE BESYLATE 10 MG TAB and lisinopril 20 mg-hydrochlorothiazide 12.5 mg tablet. The patient has been tolerating her medications well. The patient denies any chest pain, shortness of breath, orthopnea, and PND.  She reports there have been no other symptoms noted.  Yolanda Donaldson returns today for routine followup on her cholesterol. Overall, she states she is doing  well and is without any complaints or problems at this time. She specifically denies chest pain, abdominal pain, nausea, diarrhea, and myalgias. She remains on dietary management as well as the following cholesterol lowering medications atorvastatin 40 mg tablet. She last ate less than 4-6 hours ago so we will have to hold on lab draws today.    She has urinary incontinance due to pelvic prolapse and will have hysterectomy and corrective surgery this month with [facility].     Past Medical History Past Medical History:  Diagnosis Date   Stroke [facility])      Allergies No Known Allergies   Review of Systems Review of Systems  Constitutional: Negative.   HENT: Negative.    Respiratory: Negative.    Cardiovascular: Negative.   Genitourinary:  Positive for urgency.  Musculoskeletal:  Positive for back pain.  Neurological: Negative.        Objective:    Vitals BP 120/60 (BP Location: Left Arm, Patient Position: Sitting, Cuff Size: Normal)   Pulse 71   Temp 97.8 F (36.6 C)   Resp 18   Ht 5\' 3"  (1.6 m)   Wt 160 lb 4 oz (72.7 kg)   SpO2 98%   BMI 28.39 kg/m    Physical Examination Physical Exam Constitutional:      Appearance: Normal appearance.  HENT:     Head: Normocephalic and atraumatic.  Cardiovascular:     Rate and Rhythm: Normal rate and regular rhythm.     Heart sounds: Normal heart  sounds.  Pulmonary:     Effort: Pulmonary effort is normal.     Breath sounds: Normal breath sounds.  Abdominal:     General: Bowel sounds are normal.     Palpations: Abdomen is soft.  Neurological:     General: No focal deficit present.     Mental Status: She is alert and oriented to person, place, and time.        Assessment & Plan:   Primary hypertension Controlled, will do renal function today.  Chronic bilateral low back pain with left-sided sciatica Will continue with current medication. I will send refill of her pain medicine today.  Dyslipidemia Will  continue with same dse and will do lipid panel.     Return in about 2 months (around 05/08/2022).   Garwin Brothers, MD

## 2022-03-09 NOTE — Assessment & Plan Note (Signed)
Controlled, will do renal function today.

## 2022-03-09 NOTE — Addendum Note (Signed)
Addended byGarwin Brothers on: 03/09/2022 10:58 AM   Modules accepted: Orders

## 2022-03-09 NOTE — Assessment & Plan Note (Addendum)
Will continue with current medication. I will send refill of her pain medicine today.

## 2022-03-10 ENCOUNTER — Other Ambulatory Visit: Payer: Self-pay

## 2022-03-10 MED ORDER — MELOXICAM 7.5 MG PO TABS: 7.5000 mg | ORAL_TABLET | Freq: Every day | ORAL | 0 refills | Status: DC

## 2022-04-07 ENCOUNTER — Other Ambulatory Visit: Payer: Self-pay | Admitting: Internal Medicine

## 2022-04-07 MED ORDER — OXYCODONE-ACETAMINOPHEN 10-325 MG PO TABS: 1.0000 | ORAL_TABLET | Freq: Three times a day (TID) | ORAL | 0 refills | Status: DC | PRN

## 2022-04-13 ENCOUNTER — Other Ambulatory Visit: Payer: Self-pay | Admitting: Internal Medicine

## 2022-05-06 ENCOUNTER — Encounter: Payer: Self-pay | Admitting: Internal Medicine

## 2022-05-06 ENCOUNTER — Ambulatory Visit: Payer: Medicare PPO | Admitting: Internal Medicine

## 2022-05-06 VITALS — BP 124/74 | HR 74 | Temp 97.2°F | Resp 18 | Ht 63.0 in | Wt 155.2 lb

## 2022-05-06 DIAGNOSIS — E785 Hyperlipidemia, unspecified: Secondary | ICD-10-CM

## 2022-05-06 DIAGNOSIS — M5442 Lumbago with sciatica, left side: Secondary | ICD-10-CM | POA: Diagnosis not present

## 2022-05-06 DIAGNOSIS — T402X5A Adverse effect of other opioids, initial encounter: Secondary | ICD-10-CM | POA: Diagnosis not present

## 2022-05-06 DIAGNOSIS — I10 Essential (primary) hypertension: Secondary | ICD-10-CM | POA: Diagnosis not present

## 2022-05-06 DIAGNOSIS — G8929 Other chronic pain: Secondary | ICD-10-CM

## 2022-05-06 DIAGNOSIS — K5903 Drug induced constipation: Secondary | ICD-10-CM | POA: Diagnosis not present

## 2022-05-06 MED ORDER — OXYCODONE-ACETAMINOPHEN 10-325 MG PO TABS: 1.0000 | ORAL_TABLET | Freq: Three times a day (TID) | ORAL | 0 refills | Status: DC | PRN

## 2022-05-06 NOTE — Progress Notes (Signed)
Office Visit  Subjective   Patient ID: Yolanda Donaldson   DOB: Sep 12, 1955   Age: 67 y.o.   MRN: [id number]   Chief Complaint Chief Complaint  Patient presents with   Follow-up    2 month follow up     History of Present Illness The patient is a 67 year old Caucasian/White female who presents for follow up. She says that her back pain is the same as long as she takes her pain medications. She describe this pain. She can can carry out her life. Her pain radiate to left leg most often. She has numbness in her toes. She describes the pain as moderate in severity, and radiating into the right leg and left leg. The onset of the back pain was gradual and began without a clear precipitating event.  The pain is aggravated by prolonged standing, sitting, and walking The pain is alleviated by with pain medications She states that the pain does not wake her from sleep and the pain is better in the morning. She has no additional complaints. She denies bilateral paresthesias and bilateral leg weakness. Her past medical history is notable for Benign Essential Hypertension, Cerebrovascular Accident (CVA), and Hyperlipidemia.  The patient is a 67 year old Caucasian/White female who presents for a follow-up evaluation of hypertension. The patient has not been checking her blood pressure at home. The patient's current medications include: AMLODIPINE BESYLATE 10 MG TAB and lisinopril 20 mg-hydrochlorothiazide 12.5 mg tablet. The patient has been tolerating her medications well. The patient denies any chest pain, shortness of breath, orthopnea, and PND.  She reports there have been no other symptoms noted.  Yolanda Donaldson returns today for routine followup on her cholesterol. Overall, she states she is doing well and is without any complaints or problems at this time. She specifically denies chest pain, abdominal pain, nausea, diarrhea, and myalgias. She remains on dietary management as well as the following  cholesterol lowering medications atorvastatin 40 mg tablet. She last ate less than 4-6 hours ago so we will have to hold on lab draws today.     She has urinary incontinance due to pelvic prolapse and will have hysterectomy and corrective surgery this month with [facility].    Past Medical History Past Medical History:  Diagnosis Date   Stroke [facility])      Allergies No Known Allergies   Review of Systems Review of Systems  Constitutional: Negative.   HENT: Negative.    Respiratory: Negative.    Cardiovascular: Negative.   Gastrointestinal: Negative.   Musculoskeletal:  Positive for back pain.  Neurological: Negative.        Objective:    Vitals BP 124/74 (BP Location: Left Arm, Patient Position: Sitting, Cuff Size: Normal)   Pulse 74   Temp (!) 97.2 F (36.2 C)   Resp 18   Ht 5\' 3"  (1.6 m)   Wt 155 lb 4 oz (70.4 kg)   SpO2 97%   BMI 27.50 kg/m    Physical Examination Physical Exam Constitutional:      Appearance: Normal appearance.  HENT:     Head: Normocephalic and atraumatic.  Cardiovascular:     Rate and Rhythm: Normal rate and regular rhythm.     Heart sounds: Normal heart sounds.  Pulmonary:     Effort: Pulmonary effort is normal.     Breath sounds: Normal breath sounds.  Abdominal:     General: Bowel sounds are normal.     Palpations: Abdomen is soft.  Neurological:     General: No focal deficit present.     Mental Status: She is alert and oriented to person, place, and time.        Assessment & Plan:   Primary hypertension stable  Constipation due to opioid therapy Better  Chronic bilateral low back pain with left-sided sciatica She take gabapentin, percocet and does not want to increase the dose of gabapentin. I will send refill of her pain medications.   Dyslipidemia stable    Return in about 2 months (around 07/06/2022).   Garwin Brothers, MD

## 2022-05-06 NOTE — Assessment & Plan Note (Signed)
Better  

## 2022-05-06 NOTE — Assessment & Plan Note (Signed)
stable °

## 2022-05-06 NOTE — Assessment & Plan Note (Signed)
She take gabapentin, percocet and does not want to increase the dose of gabapentin. I will send refill of her pain medications.

## 2022-06-08 ENCOUNTER — Other Ambulatory Visit: Payer: Self-pay | Admitting: Internal Medicine

## 2022-06-09 ENCOUNTER — Other Ambulatory Visit: Payer: Self-pay | Admitting: Internal Medicine

## 2022-07-03 ENCOUNTER — Ambulatory Visit: Payer: Medicare PPO | Admitting: Internal Medicine

## 2022-07-03 ENCOUNTER — Encounter: Payer: Self-pay | Admitting: Internal Medicine

## 2022-07-03 VITALS — BP 124/74 | HR 65 | Temp 97.0°F | Resp 18 | Ht 63.0 in | Wt 154.5 lb

## 2022-07-03 DIAGNOSIS — T402X5A Adverse effect of other opioids, initial encounter: Secondary | ICD-10-CM

## 2022-07-03 DIAGNOSIS — K5903 Drug induced constipation: Secondary | ICD-10-CM | POA: Diagnosis not present

## 2022-07-03 DIAGNOSIS — I10 Essential (primary) hypertension: Secondary | ICD-10-CM

## 2022-07-03 DIAGNOSIS — H532 Diplopia: Secondary | ICD-10-CM | POA: Diagnosis not present

## 2022-07-03 DIAGNOSIS — G8929 Other chronic pain: Secondary | ICD-10-CM

## 2022-07-03 DIAGNOSIS — M5442 Lumbago with sciatica, left side: Secondary | ICD-10-CM | POA: Diagnosis not present

## 2022-07-03 DIAGNOSIS — E785 Hyperlipidemia, unspecified: Secondary | ICD-10-CM

## 2022-07-03 MED ORDER — OXYCODONE-ACETAMINOPHEN 10-325 MG PO TABS: 1.0000 | ORAL_TABLET | Freq: Three times a day (TID) | ORAL | 0 refills | Status: DC | PRN

## 2022-07-03 NOTE — Assessment & Plan Note (Signed)
I will send refill of percocet and she will continue with gabapentin

## 2022-07-03 NOTE — Assessment & Plan Note (Signed)
controlled 

## 2022-07-03 NOTE — Progress Notes (Signed)
Office Visit  Subjective   Patient ID: Yolanda Donaldson   DOB: [birth year]   Age: 67 y.o.   MRN: [id number]   Chief Complaint Chief Complaint  Patient presents with   Follow-up    2 month follow up     History of Present Illness The patient is a 67 year old Caucasian/White female who presents for follow up. She says that she has double vision off and on and make her head feel funny, it does not last longer, she has not seen eye doctor for this.   She has chronic low back pain, she says her pain is the same as always been, she takes  her pain medications and keep moving. She describe this pain. Her pain radiate to left leg.  She describes the pain as moderate in severity, and radiating into the right leg and left leg. The onset of the back pain was gradual and began without a clear precipitating event.  The pain is aggravated by prolonged standing, sitting, and walking The pain is alleviated by with pain medications She states that the pain does not wake her from sleep and the pain is better in the morning. She has no additional complaints. She denies bilateral paresthesias and bilateral leg weakness. Her past medical history is notable for Benign Essential Hypertension, Cerebrovascular Accident (CVA), and Hyperlipidemia.  The patient is a 67 year old Caucasian/White female who has hypertension. The patient has not been checking her blood pressure at home. The patient's current medications include: AMLODIPINE BESYLATE 10 MG TAB and lisinopril 20 mg-hydrochlorothiazide 12.5 mg tablet. The patient has been tolerating her medications well. The patient denies any chest pain, shortness of breath, orthopnea, and PND.  She reports there have been no other symptoms noted.  Yolanda Donaldson returns today for routine followup on her cholesterol. Overall, she states she is doing well and is without any complaints or problems at this time. She specifically denies chest pain, abdominal pain, nausea, diarrhea,  and myalgias. She remains on dietary management as well as the following cholesterol lowering medications atorvastatin 40 mg tablet. She last ate less than 4-6 hours ago so we will have to hold on lab draws today.     She has constipation and take linzess that does help.     Past Medical History Past Medical History:  Diagnosis Date   Stroke [facility])      Allergies No Known Allergies   Review of Systems Review of Systems  Constitutional: Negative.   HENT: Negative.    Eyes:  Positive for double vision.  Respiratory: Negative.    Cardiovascular: Negative.   Gastrointestinal: Negative.   Musculoskeletal:  Positive for back pain.  Neurological: Negative.        Objective:    Vitals BP 124/74 (BP Location: Left Arm, Patient Position: Sitting, Cuff Size: Normal)   Pulse 65   Temp (!) 97 F (36.1 C)   Resp 18   Ht 5\' 3"  (1.6 m)   Wt 154 lb 8 oz (70.1 kg)   SpO2 97%   BMI 27.37 kg/m    Physical Examination Physical Exam Constitutional:      Appearance: Normal appearance.  HENT:     Head: Normocephalic and atraumatic.  Cardiovascular:     Rate and Rhythm: Normal rate and regular rhythm.     Heart sounds: Normal heart sounds.  Pulmonary:     Effort: Pulmonary effort is normal.     Breath sounds: Normal breath sounds.  Abdominal:  General: Bowel sounds are normal.     Palpations: Abdomen is soft.  Neurological:     General: No focal deficit present.     Mental Status: She is alert and oriented to person, place, and time.        Assessment & Plan:   Primary hypertension controlled  Constipation due to opioid therapy She takes linzess and that does help  Chronic bilateral low back pain with left-sided sciatica I will send refill of percocet and she will continue with gabapentin  Double vision with both eyes open She will see eye doctor  Dyslipidemia She will continue with atorvastatin 40 mg and in 11/23. Her lipid panel was good    Return in about 2  months (around 09/02/2022).   Eloisa Northern, MD

## 2022-07-03 NOTE — Assessment & Plan Note (Signed)
She takes linzess and that does help

## 2022-07-03 NOTE — Assessment & Plan Note (Signed)
She will see eye doctor

## 2022-07-03 NOTE — Assessment & Plan Note (Signed)
She will continue with atorvastatin 40 mg and in 11/23. Her lipid panel was good

## 2022-07-18 ENCOUNTER — Other Ambulatory Visit: Payer: Self-pay | Admitting: Internal Medicine

## 2022-08-03 ENCOUNTER — Other Ambulatory Visit: Payer: Self-pay | Admitting: Internal Medicine

## 2022-08-03 MED ORDER — OXYCODONE-ACETAMINOPHEN 10-325 MG PO TABS: 1.0000 | ORAL_TABLET | Freq: Three times a day (TID) | ORAL | 0 refills | Status: DC | PRN

## 2022-08-31 ENCOUNTER — Ambulatory Visit: Payer: Medicare PPO | Admitting: Internal Medicine

## 2022-08-31 ENCOUNTER — Encounter: Payer: Self-pay | Admitting: Internal Medicine

## 2022-08-31 VITALS — BP 120/60 | HR 57 | Temp 97.2°F | Resp 18 | Ht 63.0 in | Wt 150.0 lb

## 2022-08-31 DIAGNOSIS — K5903 Drug induced constipation: Secondary | ICD-10-CM

## 2022-08-31 DIAGNOSIS — E785 Hyperlipidemia, unspecified: Secondary | ICD-10-CM

## 2022-08-31 DIAGNOSIS — G8929 Other chronic pain: Secondary | ICD-10-CM

## 2022-08-31 DIAGNOSIS — M5442 Lumbago with sciatica, left side: Secondary | ICD-10-CM

## 2022-08-31 DIAGNOSIS — T402X5A Adverse effect of other opioids, initial encounter: Secondary | ICD-10-CM | POA: Diagnosis not present

## 2022-08-31 DIAGNOSIS — M7918 Myalgia, other site: Secondary | ICD-10-CM | POA: Diagnosis not present

## 2022-08-31 DIAGNOSIS — I10 Essential (primary) hypertension: Secondary | ICD-10-CM | POA: Diagnosis not present

## 2022-08-31 MED ORDER — OXYCODONE-ACETAMINOPHEN 10-325 MG PO TABS: 1.0000 | ORAL_TABLET | Freq: Three times a day (TID) | ORAL | 0 refills | Status: DC | PRN

## 2022-08-31 NOTE — Assessment & Plan Note (Signed)
She will take meloxican twice a day for few days

## 2022-08-31 NOTE — Assessment & Plan Note (Signed)
Better with linzess

## 2022-08-31 NOTE — Assessment & Plan Note (Signed)
controlled 

## 2022-08-31 NOTE — Assessment & Plan Note (Signed)
I will send refill of her oxycodone

## 2022-08-31 NOTE — Assessment & Plan Note (Signed)
Her lipid panel was reviewed and will repeat on next visit

## 2022-08-31 NOTE — Progress Notes (Signed)
Office Visit  Subjective   Patient ID: Yolanda Donaldson   DOB: 1955/10/05   Age: 67 y.o.   MRN: [id number]   Chief Complaint Chief Complaint  Patient presents with   Follow-up   Hypertension    Primary hypertension     History of Present Illness The patient is a 67 year old Caucasian/White female who presents for follow up. She says that she fell and hurt her left buttock, and says it hurt to sit and put weight on left hip.     She has chronic low back pain, she says with pain medications, her pain is 5/10 in intensity. She takes  her pain medications and keep moving. She describe this pain. Her pain radiate to left leg.  She describes the pain as moderate in severity, and radiating into the right leg and left leg. The onset of the back pain was gradual and began without a clear precipitating event.  The pain is aggravated by prolonged standing, sitting, and walking The pain is alleviated by with pain medications She states that the pain does not wake her from sleep and the pain is better in the morning. She has no additional complaints. She denies bilateral paresthesias and bilateral leg weakness. Her past medical history is notable for Benign Essential Hypertension, Cerebrovascular Accident (CVA), and Hyperlipidemia.  The patient is a 67 year old Caucasian/White female who has hypertension. The patient has not been checking her blood pressure at home. The patient's current medications include: AMLODIPINE BESYLATE 10 MG TAB and lisinopril 20 mg-hydrochlorothiazide 12.5 mg tablet. The patient has been tolerating her medications well. The patient denies any chest pain, shortness of breath, orthopnea, and PND.  She reports there have been no other symptoms noted.  [name] returns today for routine followup on her cholesterol. Overall, she states she is doing well and is without any complaints or problems at this time. She specifically denies chest pain, abdominal pain, nausea, diarrhea,  and myalgias. She remains on dietary management as well as the following cholesterol lowering medications atorvastatin 40 mg tablet. Her lipid panel done in November was reviewed and was target control.    Past Medical History Past Medical History:  Diagnosis Date   Stroke [facility])      Allergies No Known Allergies   Review of Systems Review of Systems  Constitutional: Negative.   HENT: Negative.    Respiratory: Negative.    Cardiovascular: Negative.   Gastrointestinal: Negative.   Musculoskeletal:  Positive for back pain.  Neurological: Negative.        Objective:    Vitals BP 120/60 (BP Location: Left Arm, Patient Position: Sitting, Cuff Size: Normal)   Pulse (!) 57   Temp (!) 97.2 F (36.2 C)   Resp 18   Ht 5\' 3"  (1.6 m)   Wt 150 lb (68 kg)   SpO2 97%   BMI 26.57 kg/m    Physical Examination Physical Exam Constitutional:      Appearance: Normal appearance.  HENT:     Head: Normocephalic and atraumatic.  Cardiovascular:     Rate and Rhythm: Normal rate and regular rhythm.     Heart sounds: Normal heart sounds.  Pulmonary:     Effort: Pulmonary effort is normal.     Breath sounds: Normal breath sounds.  Abdominal:     General: Bowel sounds are normal.     Palpations: Abdomen is soft.  Neurological:     General: No focal deficit present.  Mental Status: She is alert and oriented to person, place, and time.        Assessment & Plan:   Primary hypertension controlled  Constipation due to opioid therapy Better with linzess  Chronic bilateral low back pain with left-sided sciatica I will send refill of her oxycodone  Left buttock pain She will take meloxican twice a day for few days  Dyslipidemia Her lipid panel was reviewed and will repeat on next visit    Return in about 2 months (around 11/01/2022).   Eloisa Northern, MD

## 2022-09-14 ENCOUNTER — Other Ambulatory Visit: Payer: Self-pay | Admitting: Internal Medicine

## 2022-09-30 ENCOUNTER — Other Ambulatory Visit: Payer: Self-pay | Admitting: Internal Medicine

## 2022-09-30 MED ORDER — OXYCODONE-ACETAMINOPHEN 10-325 MG PO TABS: 1.0000 | ORAL_TABLET | Freq: Three times a day (TID) | ORAL | 0 refills | Status: DC | PRN

## 2022-10-15 ENCOUNTER — Other Ambulatory Visit: Payer: Self-pay | Admitting: Internal Medicine

## 2022-10-21 ENCOUNTER — Other Ambulatory Visit: Payer: Self-pay | Admitting: Internal Medicine

## 2022-10-30 ENCOUNTER — Ambulatory Visit: Payer: Medicare PPO | Admitting: Internal Medicine

## 2022-10-30 ENCOUNTER — Encounter: Payer: Self-pay | Admitting: Internal Medicine

## 2022-10-30 VITALS — BP 116/78 | HR 72 | Temp 98.1°F | Resp 18 | Ht 63.0 in | Wt 145.5 lb

## 2022-10-30 DIAGNOSIS — I10 Essential (primary) hypertension: Secondary | ICD-10-CM

## 2022-10-30 DIAGNOSIS — Z23 Encounter for immunization: Secondary | ICD-10-CM | POA: Insufficient documentation

## 2022-10-30 DIAGNOSIS — E785 Hyperlipidemia, unspecified: Secondary | ICD-10-CM

## 2022-10-30 DIAGNOSIS — M7918 Myalgia, other site: Secondary | ICD-10-CM

## 2022-10-30 DIAGNOSIS — G8929 Other chronic pain: Secondary | ICD-10-CM

## 2022-10-30 MED ORDER — OXYCODONE-ACETAMINOPHEN 10-325 MG PO TABS: 1.0000 | ORAL_TABLET | Freq: Three times a day (TID) | ORAL | 0 refills | Status: DC | PRN

## 2022-10-30 NOTE — Assessment & Plan Note (Signed)
She take percocet and that helps with her pain

## 2022-10-30 NOTE — Assessment & Plan Note (Signed)
I will refer her to see orthopedic for left buttock pain

## 2022-10-30 NOTE — Assessment & Plan Note (Signed)
Will do AWE on next visit and will repeat labs.

## 2022-10-30 NOTE — Progress Notes (Unsigned)
Office Visit  Subjective   Patient ID: Yolanda Donaldson   DOB: Oct 11, 1955   Age: 67 y.o.   MRN: [id number]   Chief Complaint Chief Complaint  Patient presents with   Follow-up    Left buttock pain.     History of Present Illness The patient is a 67 year old Caucasian/White female who presents for follow up.    She has chronic low back pain, but she has increase pain in her left buttock pain that has been worse than the low back pain. She says she can not sit as her left buttock hurt so much. Pain is there all the time. She still hurt when she sleep in her back. She describe this pain as 6/10 in intensity.  She says that she still has chronic low back pain that radiate to her leg up to toes, she takes  her pain medications and keep moving. She describe this pain as moderate in intensity but get better with pain medication. She says that the back pain was gradual and began without a clear precipitating event.  The pain is aggravated by prolonged standing, sitting, and walking The pain is alleviated by with pain medications She states that the pain does not wake her from sleep and the pain is better in the morning. She has no additional complaints. She denies bilateral paresthesias and bilateral leg weakness. Her past medical history is notable for Benign Essential Hypertension, Cerebrovascular Accident (CVA), and Hyperlipidemia.  The patient is a 67 year old Caucasian/White female who has hypertension. The patient has not been checking her blood pressure at home. The patient's current medications include: AMLODIPINE BESYLATE 10 MG TAB and lisinopril 20 mg-hydrochlorothiazide 12.5 mg tablet. The patient has been tolerating her medications well. The patient denies any chest pain, shortness of breath, orthopnea, and PND.  She reports there have been no other symptoms noted.  Yolanda Donaldson returns today for routine followup on her cholesterol. Overall, she states she is doing well and is without  any complaints or problems at this time. She specifically denies chest pain, abdominal pain, nausea, diarrhea, and myalgias. She remains on dietary management as well as the following cholesterol lowering medications atorvastatin 40 mg tablet. She has labs drawn in 11/24 and is due for her AWE.    She says that her constipation is better now.    Past Medical History Past Medical History:  Diagnosis Date   Stroke [facility])      Allergies No Known Allergies   Review of Systems Review of Systems  Constitutional: Negative.   HENT: Negative.    Respiratory: Negative.    Cardiovascular: Negative.   Gastrointestinal: Negative.   Musculoskeletal:  Positive for back pain.       Left buttock pain  Neurological: Negative.        Objective:    Vitals BP 116/78 (BP Location: Left Arm, Patient Position: Sitting, Cuff Size: Normal)   Pulse 72   Temp 98.1 F (36.7 C)   Resp 18   Ht 5\' 3"  (1.6 m)   Wt 145 lb 8 oz (66 kg)   SpO2 99%   BMI 25.77 kg/m    Physical Examination Physical Exam Constitutional:      Appearance: Normal appearance.  HENT:     Head: Normocephalic and atraumatic.  Cardiovascular:     Rate and Rhythm: Normal rate and regular rhythm.     Heart sounds: Normal heart sounds.  Pulmonary:     Effort: Pulmonary effort is  normal.     Breath sounds: Normal breath sounds.  Abdominal:     General: Bowel sounds are normal.     Palpations: Abdomen is soft.  Neurological:     General: No focal deficit present.     Mental Status: She is alert and oriented to person, place, and time.        Assessment & Plan:   Left buttock pain I will refer her to see orthopedic for left buttock pain  Dyslipidemia Will do AWE on next visit and will repeat labs.  Chronic bilateral low back pain with left-sided sciatica She take percocet and that helps with her pain    Return in about 2 months (around 12/30/2022).   Eloisa Northern, MD

## 2022-11-30 ENCOUNTER — Other Ambulatory Visit: Payer: Self-pay | Admitting: Internal Medicine

## 2022-11-30 MED ORDER — OXYCODONE-ACETAMINOPHEN 10-325 MG PO TABS: 1.0000 | ORAL_TABLET | Freq: Three times a day (TID) | ORAL | 0 refills | Status: DC | PRN

## 2022-12-21 ENCOUNTER — Other Ambulatory Visit: Payer: Self-pay | Admitting: Internal Medicine

## 2022-12-21 MED ORDER — OXYCODONE-ACETAMINOPHEN 10-325 MG PO TABS: 1.0000 | ORAL_TABLET | Freq: Three times a day (TID) | ORAL | 0 refills | Status: DC | PRN

## 2022-12-28 ENCOUNTER — Encounter: Payer: Self-pay | Admitting: Internal Medicine

## 2022-12-28 ENCOUNTER — Ambulatory Visit: Payer: Medicare PPO | Admitting: Internal Medicine

## 2022-12-28 VITALS — BP 130/80 | HR 84 | Temp 98.0°F | Resp 18 | Ht 63.0 in | Wt 155.0 lb

## 2022-12-28 DIAGNOSIS — T402X5A Adverse effect of other opioids, initial encounter: Secondary | ICD-10-CM | POA: Diagnosis not present

## 2022-12-28 DIAGNOSIS — Z0000 Encounter for general adult medical examination without abnormal findings: Secondary | ICD-10-CM | POA: Diagnosis not present

## 2022-12-28 DIAGNOSIS — Z131 Encounter for screening for diabetes mellitus: Secondary | ICD-10-CM | POA: Insufficient documentation

## 2022-12-28 DIAGNOSIS — Z6827 Body mass index (BMI) 27.0-27.9, adult: Secondary | ICD-10-CM | POA: Diagnosis not present

## 2022-12-28 DIAGNOSIS — M5442 Lumbago with sciatica, left side: Secondary | ICD-10-CM | POA: Diagnosis not present

## 2022-12-28 DIAGNOSIS — I10 Essential (primary) hypertension: Secondary | ICD-10-CM

## 2022-12-28 DIAGNOSIS — G8929 Other chronic pain: Secondary | ICD-10-CM | POA: Diagnosis not present

## 2022-12-28 DIAGNOSIS — K5903 Drug induced constipation: Secondary | ICD-10-CM | POA: Diagnosis not present

## 2022-12-28 DIAGNOSIS — E785 Hyperlipidemia, unspecified: Secondary | ICD-10-CM | POA: Diagnosis not present

## 2022-12-28 MED ORDER — OXYCODONE-ACETAMINOPHEN 10-325 MG PO TABS: 1.0000 | ORAL_TABLET | Freq: Three times a day (TID) | ORAL | 0 refills | Status: DC | PRN

## 2022-12-28 NOTE — Progress Notes (Signed)
Office Visit  Subjective   Patient ID: Yolanda Donaldson   DOB: [birth year]   Age: 67 y.o.   MRN: [id number]   Chief Complaint Chief Complaint  Patient presents with   Annual Exam    Medicare exam      History of Present Illness The patient is a 67 year old Caucasian/White female who has come to this office for her initial Annual Wellness Visit. She is followed by myself. The patient has not seen any care providers other than those listed above.  Patient's BMI is 27 kg/m2 (01/17/2021). Her waist circumference is not aware of it. Her cognitive function is within normal limits.  The patient is experiencing the following health problems or issues: hypertension, hyperlipidemia, cerebrovascular disease, chronic low back pain, and depressive symptoms Her blood pressure has been well controlled on the patient's current medications. She does not record blood pressures at home. The patient's mixed lipid levels have been fairly well controlled on the patient's current medication regimen and are detailed in the laboratory reports. The patient has a history of stroke. The stroke occurred 13 years ago and involved the right hemisphere and resulting left sided weakness. The patient's symptoms have been unchanged since the last visit. She has a years history of chronic low back pain. The pain is currently moderate in intensity and has been essentially unchanged since the last visit. She is is being treated with pain medication. The pain radiates into right lower leg. The patient reports her current level of activities are moderately impaired. She is not currently employed. The patient's mood has been improving with medication.  She has no additional complaints.  She denies abdominal pain, chest discomfort, facial pain, and paroxysmal nocturnal dyspnea.  The patient states she has been compliant with taking her medications as directed but has been having difficulty complying with the recommended levels of diet and  exercise.  The patient's medications include  amlodipine 10 mg tablet, aspirin 81 mg oral tablet,delayed release (DR/EC), atorvastatin 40 mg tablet, meloxicam 7.5 mg tablet, metoprolol tartrate oral tablet 25 mg, Percocet oral tablet 10-325 mg, and tizanidine 4 mg oral capsule.  The patient has not undergone any interval lab work, imaging studies or tests since the last visit.  Patient has the following risk factors for depression: past experience with depression. Patient hsa been screened for depression and screening was negative.  The patient's functional ability is noted by this provider to be is abnormal. Her level of safety is noted to be compromised because of recurrent falls.  A written screening schedule was established for the patient. See Plan for details.  There are risk factors present, including smoker, sedentary lifestyle, and at risk for falls. These risk factors require intervention. See Plan for details on treatment options and associated risks and benefits. Personalized health advice was given to the patient. See Plan for details. Advanced planning was discussed with patient and details are also noted in Plan.  She live with her husband. She smoke 1 PPD, she is not wiling to quit. She does not drink.  She did not have mammogram in 4-5 years but is willing to go for it. She also never has colonoscopy and wanted to do FIT test. She get flu shot every year and got this year already. She did not have pneumonia vaccine and will do so.  She has no fall. She denies any problem with ADL at home. She score 30/30 on MMSE.    Past Medical History Past Medical History:  Diagnosis Date   Stroke [facility])      Allergies No Known Allergies   Review of Systems Review of Systems  Constitutional: Negative.   HENT: Negative.    Respiratory: Negative.    Cardiovascular: Negative.   Gastrointestinal: Negative.   Neurological: Negative.        Objective:    Vitals BP 130/80 (BP Location:  Left Arm, Patient Position: Sitting, Cuff Size: Normal)   Pulse 84   Temp 98 F (36.7 C)   Resp 18   Ht 5\' 3"  (1.6 m)   Wt 155 lb (70.3 kg)   SpO2 94%   BMI 27.46 kg/m    Physical Examination Physical Exam Constitutional:      Appearance: Normal appearance.  HENT:     Head: Normocephalic and atraumatic.  Cardiovascular:     Rate and Rhythm: Normal rate and regular rhythm.     Heart sounds: Normal heart sounds.  Pulmonary:     Effort: Pulmonary effort is normal.     Breath sounds: Normal breath sounds.  Abdominal:     General: Bowel sounds are normal.     Palpations: Abdomen is soft.  Neurological:     General: No focal deficit present.     Mental Status: She is alert and oriented to person, place, and time.        Assessment & Plan:   No problem-specific Assessment & Plan notes found for this encounter.    Return in about 2 months (around 02/27/2023).   Eloisa Northern, MD

## 2022-12-29 ENCOUNTER — Other Ambulatory Visit: Payer: Self-pay | Admitting: Internal Medicine

## 2022-12-29 LAB — CBC WITH DIFFERENTIAL/PLATELET
Basophils Absolute: 0.1 10*3/uL (ref 0.0–0.2)
Basos: 1 %
EOS (ABSOLUTE): 0.3 10*3/uL (ref 0.0–0.4)
Eos: 3 %
Hematocrit: 45.3 % (ref 34.0–46.6)
Hemoglobin: 14.7 g/dL (ref 11.1–15.9)
Immature Grans (Abs): 0.1 10*3/uL (ref 0.0–0.1)
Immature Granulocytes: 1 %
Lymphocytes Absolute: 2.6 10*3/uL (ref 0.7–3.1)
Lymphs: 27 %
MCH: 29.4 pg (ref 26.6–33.0)
MCHC: 32.5 g/dL (ref 31.5–35.7)
MCV: 91 fL (ref 79–97)
Monocytes Absolute: 0.6 10*3/uL (ref 0.1–0.9)
Monocytes: 6 %
Neutrophils Absolute: 6 10*3/uL (ref 1.4–7.0)
Neutrophils: 62 %
Platelets: 288 10*3/uL (ref 150–450)
RBC: 5 x10E6/uL (ref 3.77–5.28)
RDW: 13.2 % (ref 11.7–15.4)
WBC: 9.6 10*3/uL (ref 3.4–10.8)

## 2022-12-29 LAB — CMP14 + ANION GAP
ALT: 12 [IU]/L (ref 0–32)
AST: 16 [IU]/L (ref 0–40)
Albumin: 4.1 g/dL (ref 3.9–4.9)
Alkaline Phosphatase: 157 [IU]/L — ABNORMAL HIGH (ref 44–121)
Anion Gap: 18 mmol/L (ref 10.0–18.0)
BUN/Creatinine Ratio: 14 (ref 12–28)
BUN: 11 mg/dL (ref 8–27)
Bilirubin Total: 0.5 mg/dL (ref 0.0–1.2)
CO2: 21 mmol/L (ref 20–29)
Calcium: 9.6 mg/dL (ref 8.7–10.3)
Chloride: 100 mmol/L (ref 96–106)
Creatinine, Ser: 0.76 mg/dL (ref 0.57–1.00)
Globulin, Total: 2.6 g/dL (ref 1.5–4.5)
Glucose: 118 mg/dL — ABNORMAL HIGH (ref 70–99)
Potassium: 5 mmol/L (ref 3.5–5.2)
Sodium: 139 mmol/L (ref 134–144)
Total Protein: 6.7 g/dL (ref 6.0–8.5)
eGFR: 86 mL/min/{1.73_m2} (ref >59)

## 2022-12-29 LAB — LIPID PANEL
Chol/HDL Ratio: 3.7 ratio (ref 0.0–4.4)
Cholesterol, Total: 148 mg/dL (ref 100–199)
HDL: 40 mg/dL (ref >39)
LDL Chol Calc (NIH): 79 mg/dL (ref 0–99)
Triglycerides: 170 mg/dL — ABNORMAL HIGH (ref 0–149)
VLDL Cholesterol Cal: 29 mg/dL (ref 5–40)

## 2022-12-29 LAB — VITAMIN D 25 HYDROXY (VIT D DEFICIENCY, FRACTURES): Vit D, 25-Hydroxy: 15.4 ng/mL — ABNORMAL LOW (ref 30.0–100.0)

## 2022-12-29 LAB — HEMOGLOBIN A1C
Est. average glucose Bld gHb Est-mCnc: 117 mg/dL
Hgb A1c MFr Bld: 5.7 % — ABNORMAL HIGH (ref 4.8–5.6)

## 2022-12-29 LAB — TSH: TSH: 1.43 u[IU]/mL (ref 0.450–4.500)

## 2022-12-29 MED ORDER — OXYCODONE-ACETAMINOPHEN 10-325 MG PO TABS: 1.0000 | ORAL_TABLET | Freq: Three times a day (TID) | ORAL | 0 refills | Status: DC | PRN

## 2023-01-15 ENCOUNTER — Other Ambulatory Visit: Payer: Self-pay | Admitting: Internal Medicine

## 2023-01-16 ENCOUNTER — Other Ambulatory Visit: Payer: Self-pay | Admitting: Internal Medicine

## 2023-02-26 ENCOUNTER — Ambulatory Visit: Payer: Medicare PPO | Admitting: Internal Medicine

## 2023-02-26 ENCOUNTER — Encounter: Payer: Self-pay | Admitting: Internal Medicine

## 2023-02-26 VITALS — BP 124/60 | HR 71 | Temp 97.8°F | Resp 18 | Ht 63.0 in | Wt 155.2 lb

## 2023-02-26 DIAGNOSIS — M5442 Lumbago with sciatica, left side: Secondary | ICD-10-CM

## 2023-02-26 DIAGNOSIS — E785 Hyperlipidemia, unspecified: Secondary | ICD-10-CM

## 2023-02-26 DIAGNOSIS — G8929 Other chronic pain: Secondary | ICD-10-CM

## 2023-02-26 DIAGNOSIS — I10 Essential (primary) hypertension: Secondary | ICD-10-CM | POA: Diagnosis not present

## 2023-02-26 MED ORDER — OXYCODONE-ACETAMINOPHEN 10-325 MG PO TABS: 1.0000 | ORAL_TABLET | Freq: Three times a day (TID) | ORAL | 0 refills | Status: DC | PRN

## 2023-02-26 NOTE — Assessment & Plan Note (Signed)
controlled 

## 2023-02-26 NOTE — Assessment & Plan Note (Signed)
She will continue with atorvastatin 40 mg daily.

## 2023-02-26 NOTE — Progress Notes (Signed)
Office Visit  Subjective   Patient ID: Yolanda Donaldson   DOB: [birth year]   Age: 68 y.o.   MRN: [id number]   Chief Complaint Chief Complaint  Patient presents with   Primary hypertension    2 month  Follow up     History of Present Illness The patient is a 68 year old Caucasian/White female who presents for follow up. She has annual wellness exam in November and I have reviewed her labs with her.  Her vitamin D level was very low.  She is taking vitamin-D replacement.   She has chronic low back pain,  she says that her pain remain same.  If he does not take pain medication than pain get worse but it never goes away completely even with pain medications. She still hurt when she sleep in her back. She describe this pain as 6/10 in intensity. She has chronic low back pain that radiate to her leg up to toes, she takes  her pain medications and keep moving. The pain is aggravated by prolonged standing, sitting, and walking The pain is alleviated by with pain medications   Her past medical history is notable for Benign Essential Hypertension, Cerebrovascular Accident (CVA), and Hyperlipidemia.  The patient is a 68 year old Caucasian/White female who has hypertension. The patient has not been checking her blood pressure at home. The patient's current medications include: AMLODIPINE BESYLATE 10 MG TAB and lisinopril 20 mg-hydrochlorothiazide 12.5 mg tablet. The patient has been tolerating her medications well. The patient denies any chest pain, shortness of breath, orthopnea, and PND.  She reports there have been no other symptoms noted.  [name] returns today for routine followup on her cholesterol. Overall, she states she is doing well and is without any complaints or problems at this time. She specifically denies chest pain, abdominal pain, nausea, diarrhea, and myalgias. She remains on dietary management as well as the following cholesterol lowering medications atorvastatin 40 mg tablet. She  has labs drawn in 11/24 and that was reviewed with her.        Past Medical History Past Medical History:  Diagnosis Date   Stroke [facility])      Allergies No Known Allergies   Review of Systems Review of Systems  Constitutional: Negative.   Respiratory: Negative.    Cardiovascular: Negative.   Musculoskeletal:  Positive for back pain.  Neurological: Negative.        Objective:    Vitals BP 124/60 (BP Location: Left Arm, Cuff Size: Normal)   Pulse 71   Temp 97.8 F (36.6 C)   Resp 18   Ht 5\' 3"  (1.6 m)   Wt 155 lb 3 oz (70.4 kg)   SpO2 99%   BMI 27.49 kg/m    Physical Examination Physical Exam Constitutional:      Appearance: Normal appearance.  HENT:     Head: Normocephalic and atraumatic.  Eyes:     Extraocular Movements: Extraocular movements intact.     Pupils: Pupils are equal, round, and reactive to light.  Cardiovascular:     Rate and Rhythm: Normal rate and regular rhythm.     Heart sounds: Normal heart sounds.  Pulmonary:     Effort: Pulmonary effort is normal.     Breath sounds: Normal breath sounds.  Abdominal:     General: Abdomen is flat. Bowel sounds are normal.     Palpations: Abdomen is soft.  Neurological:     General: No focal deficit present.  Mental Status: She is alert and oriented to person, place, and time.        Assessment & Plan:   Primary hypertension controlled  Chronic bilateral low back pain with left-sided sciatica   I will send refill of her pain medication  Dyslipidemia  She will continue with atorvastatin 40 mg daily.    Return in about 2 months (around 04/26/2023).   Eloisa Northern, MD

## 2023-02-26 NOTE — Assessment & Plan Note (Signed)
I will send refill of her pain medication.

## 2023-04-23 ENCOUNTER — Encounter: Payer: Self-pay | Admitting: Internal Medicine

## 2023-04-23 ENCOUNTER — Ambulatory Visit: Payer: Medicare PPO | Admitting: Internal Medicine

## 2023-04-23 VITALS — BP 130/80 | HR 56 | Temp 98.0°F | Resp 18 | Ht 63.0 in | Wt 152.4 lb

## 2023-04-23 DIAGNOSIS — T402X5A Adverse effect of other opioids, initial encounter: Secondary | ICD-10-CM

## 2023-04-23 DIAGNOSIS — E785 Hyperlipidemia, unspecified: Secondary | ICD-10-CM | POA: Diagnosis not present

## 2023-04-23 DIAGNOSIS — M5442 Lumbago with sciatica, left side: Secondary | ICD-10-CM

## 2023-04-23 DIAGNOSIS — K5903 Drug induced constipation: Secondary | ICD-10-CM | POA: Diagnosis not present

## 2023-04-23 DIAGNOSIS — I10 Essential (primary) hypertension: Secondary | ICD-10-CM | POA: Diagnosis not present

## 2023-04-23 DIAGNOSIS — G8929 Other chronic pain: Secondary | ICD-10-CM

## 2023-04-23 MED ORDER — OXYCODONE-ACETAMINOPHEN 10-325 MG PO TABS: 1.0000 | ORAL_TABLET | Freq: Three times a day (TID) | ORAL | 0 refills | Status: DC | PRN

## 2023-04-23 MED ORDER — GABAPENTIN 300 MG PO CAPS: 300.0000 mg | ORAL_CAPSULE | Freq: Two times a day (BID) | ORAL | 3 refills | Status: DC

## 2023-04-23 NOTE — Assessment & Plan Note (Signed)
 I will restart gabapentin 300 mg twice a day and continue with percocet

## 2023-04-23 NOTE — Assessment & Plan Note (Signed)
 controlled

## 2023-04-23 NOTE — Assessment & Plan Note (Signed)
 Better

## 2023-04-23 NOTE — Progress Notes (Signed)
   Office Visit  Subjective   Patient ID: Yolanda Donaldson   DOB: [birth year]   Age: 68 y.o.   MRN: [id number]   Chief Complaint Chief Complaint  Patient presents with   Follow-up    Pain Medication follow up     History of Present Illness 68 years old female is here for follow up. She has chronic low back pain, she says that she wishes that her pain goes away completely but she has pain all the times. Sometime the pain is worse. She is asking if I can add gabapentin to see if that help with her pain. She describe her pain 3-4/10 in intensity. The pain is aggravated by prolonged standing, sitting, and walking The pain is alleviated by with pain medications    Her past medical history is notable for Benign Essential Hypertension, Cerebrovascular Accident (CVA), and Hyperlipidemia.  The patient is a 68 year old Caucasian/White female who has hypertension. The patient has not been checking her blood pressure at home. The patient's current medications include: AMLODIPINE BESYLATE 10 MG TAB and lisinopril 20 mg-hydrochlorothiazide 12.5 mg tablet. The patient has been tolerating her medications well. The patient denies any chest pain, shortness of breath, orthopnea, and PND.  She reports there have been no other symptoms noted.  Yolanda Donaldson returns today for routine followup on her cholesterol. Overall, she states she is doing well and is without any complaints or problems at this time. She specifically denies chest pain, abdominal pain, nausea, diarrhea, and myalgias. She remains on dietary management as well as the following cholesterol lowering medications atorvastatin 40 mg tablet. She has labs drawn in 11/24 and that was reviewed with her.      Past Medical History Past Medical History:  Diagnosis Date   Stroke [facility])      Allergies No Known Allergies   Review of Systems Review of Systems  Constitutional: Negative.   HENT: Negative.    Respiratory: Negative.    Cardiovascular:  Negative.   Gastrointestinal: Negative.   Neurological: Negative.        Objective:    Vitals BP 130/80 (BP Location: Left Arm, Patient Position: Sitting, Cuff Size: Normal)   Pulse (!) 56   Temp 98 F (36.7 C)   Resp 18   Ht 5\' 3"  (1.6 m)   Wt 152 lb 6 oz (69.1 kg)   SpO2 97%   BMI 26.99 kg/m    Physical Examination Physical Exam Constitutional:      Appearance: Normal appearance.  HENT:     Head: Normocephalic and atraumatic.  Cardiovascular:     Rate and Rhythm: Normal rate and regular rhythm.     Heart sounds: Normal heart sounds.  Pulmonary:     Effort: Pulmonary effort is normal.     Breath sounds: Normal breath sounds.  Abdominal:     General: Bowel sounds are normal.     Palpations: Abdomen is soft.  Neurological:     General: No focal deficit present.     Mental Status: She is alert and oriented to person, place, and time.        Assessment & Plan:   Primary hypertension controlled  Constipation due to opioid therapy Better  Chronic bilateral low back pain with left-sided sciatica I will restart gabapentin 300 mg twice a day and continue with percocet  Dyslipidemia She takes atorvastatin 40 mg daily    Return in about 2 months (around 06/23/2023).   Eloisa Northern, MD

## 2023-04-23 NOTE — Assessment & Plan Note (Signed)
 She takes atorvastatin 40 mg daily.

## 2023-04-26 ENCOUNTER — Other Ambulatory Visit: Payer: Self-pay | Admitting: Internal Medicine

## 2023-05-25 ENCOUNTER — Other Ambulatory Visit: Payer: Self-pay | Admitting: Internal Medicine

## 2023-05-25 MED ORDER — OXYCODONE-ACETAMINOPHEN 10-325 MG PO TABS: 1.0000 | ORAL_TABLET | Freq: Three times a day (TID) | ORAL | 0 refills | Status: DC | PRN

## 2023-06-21 ENCOUNTER — Encounter: Payer: Self-pay | Admitting: Internal Medicine

## 2023-06-21 ENCOUNTER — Ambulatory Visit: Admitting: Internal Medicine

## 2023-06-21 VITALS — BP 124/80 | HR 80 | Temp 98.0°F | Resp 18 | Ht 63.0 in | Wt 154.5 lb

## 2023-06-21 DIAGNOSIS — E785 Hyperlipidemia, unspecified: Secondary | ICD-10-CM

## 2023-06-21 DIAGNOSIS — T402X5A Adverse effect of other opioids, initial encounter: Secondary | ICD-10-CM

## 2023-06-21 DIAGNOSIS — K5903 Drug induced constipation: Secondary | ICD-10-CM

## 2023-06-21 DIAGNOSIS — G8929 Other chronic pain: Secondary | ICD-10-CM

## 2023-06-21 DIAGNOSIS — M5442 Lumbago with sciatica, left side: Secondary | ICD-10-CM | POA: Diagnosis not present

## 2023-06-21 DIAGNOSIS — I10 Essential (primary) hypertension: Secondary | ICD-10-CM | POA: Diagnosis not present

## 2023-06-21 DIAGNOSIS — E559 Vitamin D deficiency, unspecified: Secondary | ICD-10-CM | POA: Diagnosis not present

## 2023-06-21 MED ORDER — OXYCODONE-ACETAMINOPHEN 10-325 MG PO TABS: 1.0000 | ORAL_TABLET | Freq: Three times a day (TID) | ORAL | 0 refills | Status: DC | PRN

## 2023-06-21 MED ORDER — VITAMIN D3 125 MCG (5000 UT) PO CAPS: 1.0000 | ORAL_CAPSULE | Freq: Every day | ORAL | 6 refills | Status: AC

## 2023-06-21 NOTE — Progress Notes (Signed)
 Office Visit  Subjective   Patient ID: Yolanda Donaldson   DOB: Mar 30, 1955   Age: 68 y.o.   MRN: [id number]   Chief Complaint Chief Complaint  Patient presents with   Hypertension    2 month follow up     History of Present Illness 68 years old female is here for follow up.   She has a chronic back pain that radiate to her left leg.  She takes Percocet 1 tablets 3 times a day that help with her pain. She says that her pain has not changed. Her pain get worse with walking. She describe her pain 3-4/10 in intensity. The pain is aggravated by prolonged standing, sitting, and walking The pain is alleviated by with pain medications    Her past medical history is notable for Benign Essential Hypertension, Cerebrovascular Accident (CVA), and Hyperlipidemia.   The patient is a 68 year old Caucasian/White female who has hypertension. The patient has not been checking her blood pressure at home. The patient's current medications include: AMLODIPINE  BESYLATE 10 MG TAB and lisinopril 20 mg-hydrochlorothiazide 12.5 mg tablet. The patient has been tolerating her medications well. The patient denies any chest pain, shortness of breath, orthopnea, and PND.  She reports there have been no other symptoms noted.  [name] returns today for routine followup on her cholesterol. Overall, she states she is doing well and is without any complaints or problems at this time. She specifically denies chest pain, abdominal pain, nausea, diarrhea, and myalgias. She remains on dietary management as well as the following cholesterol lowering medications atorvastatin  40 mg tablet. She has labs drawn in 11/24 and that was reviewed with her.      Past Medical History Past Medical History:  Diagnosis Date   Stroke [facility])      Allergies No Known Allergies   Review of Systems Review of Systems  Constitutional: Negative.   Respiratory: Negative.    Cardiovascular: Negative.   Gastrointestinal: Negative.    Musculoskeletal:  Positive for back pain.  Neurological: Negative.        Objective:    Vitals BP 124/80 (BP Location: Left Arm, Patient Position: Sitting, Cuff Size: Normal)   Pulse 80   Temp 98 F (36.7 C)   Resp 18   Ht 5\' 3"  (1.6 m)   Wt 154 lb 8 oz (70.1 kg)   SpO2 98%   BMI 27.37 kg/m    Physical Examination Physical Exam Constitutional:      Appearance: Normal appearance.  Cardiovascular:     Rate and Rhythm: Normal rate and regular rhythm.     Heart sounds: Normal heart sounds.  Pulmonary:     Effort: Pulmonary effort is normal.     Breath sounds: Normal breath sounds.  Abdominal:     General: Bowel sounds are normal.     Palpations: Abdomen is soft.  Neurological:     General: No focal deficit present.     Mental Status: She is alert and oriented to person, place, and time.       Assessment & Plan:   Primary hypertension   Blood pressure is well controlled on amlodipine  10 mg daily and metoprolol  25 mg twice a day.  Constipation due to opioid therapy   Better.  Chronic bilateral low back pain with left-sided sciatica   Her pain is manageable with gabapentin  300 mg twice a day and oxycodone  10/325 mg 1 tablets 3 times a day.  I have sent prescription for refill of  oxycodone  with 1 refill.  Dyslipidemia   She take atorvastatin  40 mg daily.  Her labs were drawn in October last year.  Vitamin D  deficiency   She has vitamin-D deficiency where her vitamin-D level was 14.  She is not taking any replacement for vitamin-D deficiency.  She will take 1 capsule daily for 4 months then 1 twice a week.    Return in about 2 months (around 08/21/2023).   Tita Form, MD

## 2023-06-21 NOTE — Assessment & Plan Note (Signed)
 Her pain is manageable with gabapentin  300 mg twice a day and oxycodone  10/325 mg 1 tablets 3 times a day.  I have sent prescription for refill of oxycodone  with 1 refill.

## 2023-06-21 NOTE — Assessment & Plan Note (Signed)
 Better

## 2023-06-21 NOTE — Assessment & Plan Note (Signed)
 Blood pressure is well controlled on amlodipine  10 mg daily and metoprolol  25 mg twice a day.

## 2023-06-21 NOTE — Assessment & Plan Note (Signed)
 She has vitamin-D deficiency where her vitamin-D level was 14.  She is not taking any replacement for vitamin-D deficiency.  She will take 1 capsule daily for 4 months then 1 twice a week.

## 2023-06-21 NOTE — Assessment & Plan Note (Signed)
 She take atorvastatin  40 mg daily.  Her labs were drawn in October last year.

## 2023-06-22 ENCOUNTER — Other Ambulatory Visit: Payer: Self-pay | Admitting: Internal Medicine

## 2023-07-14 ENCOUNTER — Other Ambulatory Visit: Payer: Self-pay | Admitting: Internal Medicine

## 2023-08-18 ENCOUNTER — Ambulatory Visit: Admitting: Internal Medicine

## 2023-08-18 ENCOUNTER — Encounter: Payer: Self-pay | Admitting: Internal Medicine

## 2023-08-18 VITALS — BP 130/80 | HR 72 | Temp 97.6°F | Resp 18 | Ht 63.0 in | Wt 155.0 lb

## 2023-08-18 DIAGNOSIS — G8929 Other chronic pain: Secondary | ICD-10-CM | POA: Diagnosis not present

## 2023-08-18 DIAGNOSIS — M5442 Lumbago with sciatica, left side: Secondary | ICD-10-CM | POA: Diagnosis not present

## 2023-08-18 DIAGNOSIS — K5903 Drug induced constipation: Secondary | ICD-10-CM | POA: Diagnosis not present

## 2023-08-18 DIAGNOSIS — E785 Hyperlipidemia, unspecified: Secondary | ICD-10-CM | POA: Diagnosis not present

## 2023-08-18 DIAGNOSIS — T402X5A Adverse effect of other opioids, initial encounter: Secondary | ICD-10-CM

## 2023-08-18 DIAGNOSIS — R350 Frequency of micturition: Secondary | ICD-10-CM | POA: Diagnosis not present

## 2023-08-18 DIAGNOSIS — I10 Essential (primary) hypertension: Secondary | ICD-10-CM

## 2023-08-18 MED ORDER — OXYCODONE-ACETAMINOPHEN 10-325 MG PO TABS: 1.0000 | ORAL_TABLET | Freq: Three times a day (TID) | ORAL | 0 refills | Status: DC | PRN

## 2023-08-18 NOTE — Assessment & Plan Note (Signed)
 Her blood pressure is controlled.  He takes metoprolol  25 mg twice a day, amlodipine  10 mg daily. She do not have any edema.

## 2023-08-18 NOTE — Assessment & Plan Note (Signed)
 She takes atorvastatin  40 mg daily.  Will repeat lipid panel on next visit.

## 2023-08-18 NOTE — Assessment & Plan Note (Signed)
 I will do urine analysis but she could not give us  urine sample.  We have given her cup so she can bring urine sample from home.

## 2023-08-18 NOTE — Assessment & Plan Note (Signed)
 She is due for refill of her medication.

## 2023-08-18 NOTE — Assessment & Plan Note (Signed)
 Better and she take Linzess  145 mcg daily.

## 2023-08-18 NOTE — Progress Notes (Signed)
 Office Visit  Subjective   Patient ID: Yolanda Donaldson   DOB: 07/21/1955   Age: 68 y.o.   MRN: [id number]   Chief Complaint Chief Complaint  Patient presents with   Follow-up    2 month follow up     History of Present Illness 68 years old female is here for follow up.   She is complaining of increased urination from mid day to 3 AM every day for 3 months. She denies having any burning urination. She says that 80 so frustrating that she cannot sleep because she keeps going to bathroom multiple time at nighttime. She denies drinking any Tea, soda or caffeine.  She has a chronic back pain that radiate to her left leg.  She takes Percocet 1 tablets 3 times a day that help with her pain.   She described this pain 4 to 5/10 in intensity.  She says the pain medication helped with her pain only for 3-4 hours. Her pain get worse with walking. The pain is aggravated by prolonged standing, sitting, and walking The pain is alleviated by with pain medications    The patient is a 68 year old Caucasian/White female who has hypertension. The patient has not been checking her blood pressure at home. The patient's current medications include: AMLODIPINE  BESYLATE 10 MG TAB and lisinopril 20 mg-hydrochlorothiazide 12.5 mg tablet. The patient has been tolerating her medications well. The patient denies any chest pain, shortness of breath, orthopnea, and PND.  She reports there have been no other symptoms noted.   Yolanda Donaldson returns today for routine followup on her cholesterol. Overall, she states she is doing well and is without any complaints or problems at this time. She specifically denies chest pain, abdominal pain, nausea, diarrhea, and myalgias. She remains on dietary management as well as the following cholesterol lowering medications atorvastatin  40 mg tablet. She has labs drawn in 11/24 and that was reviewed with her.   She has borderline diabetes and her hemoglobin A1c was 5.16 November 2022.   Past Medical History Past Medical History:  Diagnosis Date   Stroke [facility])      Allergies No Known Allergies   Review of Systems Review of Systems  Constitutional: Negative.   HENT: Negative.    Respiratory: Negative.    Cardiovascular: Negative.   Gastrointestinal: Negative.   Genitourinary:  Positive for frequency.  Musculoskeletal:  Positive for back pain.  Neurological: Negative.        Objective:    Vitals BP 130/80   Pulse 72   Temp 97.6 F (36.4 C)   Resp 18   Ht 5' 3 (1.6 m)   Wt 155 lb (70.3 kg)   SpO2 98%   BMI 27.46 kg/m    Physical Examination Physical Exam Constitutional:      Appearance: Normal appearance.  HENT:     Head: Normocephalic and atraumatic.  Cardiovascular:     Rate and Rhythm: Normal rate and regular rhythm.     Heart sounds: Normal heart sounds.  Pulmonary:     Effort: Pulmonary effort is normal.     Breath sounds: Normal breath sounds.  Abdominal:     General: Bowel sounds are normal.     Palpations: Abdomen is soft.  Neurological:     General: No focal deficit present.     Mental Status: She is alert and oriented to person, place, and time.        Assessment & Plan:   Primary hypertension  Her blood pressure is controlled.  He takes metoprolol  25 mg twice a day, amlodipine  10 mg daily. She do not have any edema.  Constipation due to opioid therapy   Better and she take Linzess  145 mcg daily.  Chronic bilateral low back pain with left-sided sciatica   She is due for refill of her medication.  Increased urinary frequency   I will do urine analysis but she could not give us  urine sample.  We have given her cup so she can bring urine sample from home.  Dyslipidemia   She takes atorvastatin  40 mg daily.  Will repeat lipid panel on next visit.    Return in about 2 months (around 10/19/2023).   Roetta Dare, MD

## 2023-08-19 ENCOUNTER — Ambulatory Visit: Admitting: Internal Medicine

## 2023-08-19 DIAGNOSIS — R350 Frequency of micturition: Secondary | ICD-10-CM

## 2023-08-19 LAB — POCT URINALYSIS DIPSTICK
Bilirubin, UA: NEGATIVE
Blood, UA: NEGATIVE
Glucose, UA: NEGATIVE
Ketones, UA: NEGATIVE
Nitrite, UA: NEGATIVE
Protein, UA: NEGATIVE
Spec Grav, UA: 1.01 (ref 1.010–1.025)
Urobilinogen, UA: 0.2 U/dL
pH, UA: 6 (ref 5.0–8.0)

## 2023-08-19 NOTE — Progress Notes (Signed)
 Nurse Visit- Urinalysis

## 2023-08-24 ENCOUNTER — Other Ambulatory Visit: Payer: Self-pay | Admitting: Internal Medicine

## 2023-10-15 ENCOUNTER — Ambulatory Visit: Admitting: Internal Medicine

## 2023-10-15 ENCOUNTER — Encounter: Payer: Self-pay | Admitting: Internal Medicine

## 2023-10-15 VITALS — BP 110/60 | HR 64 | Temp 98.2°F | Resp 18 | Ht 63.0 in | Wt 144.1 lb

## 2023-10-15 DIAGNOSIS — E785 Hyperlipidemia, unspecified: Secondary | ICD-10-CM

## 2023-10-15 DIAGNOSIS — R634 Abnormal weight loss: Secondary | ICD-10-CM | POA: Diagnosis not present

## 2023-10-15 DIAGNOSIS — G8929 Other chronic pain: Secondary | ICD-10-CM | POA: Diagnosis not present

## 2023-10-15 DIAGNOSIS — I10 Essential (primary) hypertension: Secondary | ICD-10-CM | POA: Diagnosis not present

## 2023-10-15 DIAGNOSIS — M5442 Lumbago with sciatica, left side: Secondary | ICD-10-CM | POA: Diagnosis not present

## 2023-10-15 DIAGNOSIS — R7303 Prediabetes: Secondary | ICD-10-CM

## 2023-10-15 MED ORDER — OXYCODONE-ACETAMINOPHEN 10-325 MG PO TABS: 1.0000 | ORAL_TABLET | Freq: Three times a day (TID) | ORAL | 0 refills | Status: DC | PRN

## 2023-10-15 NOTE — Assessment & Plan Note (Signed)
 I will do TSH, CBC, three days of stool occult blood

## 2023-10-15 NOTE — Progress Notes (Signed)
 Office Visit  Subjective   Patient ID: Yolanda Donaldson   DOB: Mar 26, 1955   Age: 68 y.o.   MRN: [id number]   Chief Complaint Chief Complaint  Patient presents with   Back Pain    2 month follow up     History of Present Illness 68 years old female is here for follow up. She says that she get full very easily so she don't eat much. She has lost 11 more pounds since last visit. She denies feeling tired. She denies being depress.   She has a chronic back pain that radiate to her left leg.  She takes Percocet 1 tablets 3 times a day that help with her pain.   She described this pain 4 to 5/10 in intensity.  She says the pain medication helped with her pain only for 3-4 hours. Her pain get worse with walking. The pain is aggravated by prolonged standing, sitting, and walking The pain is alleviated by with pain medications    The patient is a 68 year old Caucasian/White female who has hypertension. The patient has not been checking her blood pressure at home. The patient's current medications include: AMLODIPINE  BESYLATE 10 MG TAB and lisinopril 20 mg-hydrochlorothiazide 12.5 mg tablet. The patient has been tolerating her medications well. The patient denies any chest pain, shortness of breath, orthopnea, and PND.  She reports there have been no other symptoms noted.    Yolanda Donaldson returns today for routine followup on her cholesterol. Overall, she states she is doing well and is without any complaints or problems at this time. She specifically denies chest pain, abdominal pain, nausea, diarrhea, and myalgias. She remains on dietary management as well as the following cholesterol lowering medications atorvastatin  40 mg tablet. She has labs drawn in 11/24 and that was reviewed with her.   She has borderline diabetes and her hemoglobin A1c was 5.16 November 2022.  Past Medical History Past Medical History:  Diagnosis Date   Stroke [facility])      Allergies No Known Allergies   Review of  Systems Review of Systems  Constitutional: Negative.   HENT: Negative.    Respiratory: Negative.    Cardiovascular: Negative.   Gastrointestinal: Negative.        Early fullness  Musculoskeletal:  Positive for back pain.  Neurological: Negative.        Objective:    Vitals BP 110/60 (BP Location: Left Arm, Patient Position: Sitting, Cuff Size: Normal)   Pulse 64   Temp 98.2 F (36.8 C)   Resp 18   Ht 5' 3 (1.6 m)   Wt 144 lb 2 oz (65.4 kg)   SpO2 98%   BMI 25.53 kg/m    Physical Examination Physical Exam Constitutional:      Appearance: Normal appearance.  HENT:     Head: Normocephalic and atraumatic.  Cardiovascular:     Rate and Rhythm: Normal rate and regular rhythm.     Heart sounds: Normal heart sounds.  Pulmonary:     Effort: Pulmonary effort is normal.     Breath sounds: Normal breath sounds.  Abdominal:     General: Bowel sounds are normal.     Palpations: Abdomen is soft.  Neurological:     General: No focal deficit present.     Mental Status: She is alert and oriented to person, place, and time.        Assessment & Plan:   Primary hypertension Her blood pressure is well controlled  Chronic  bilateral low back pain with left-sided sciatica I will send her pain medication refill  Weight loss I will do TSH, CBC, three days of stool occult blood  Dyslipidemia She takes atorvastatin  40 mg daily    Return in about 1 month (around 11/14/2023).   Roetta Dare, MD

## 2023-10-15 NOTE — Assessment & Plan Note (Signed)
 I will send her pain medication refill

## 2023-10-15 NOTE — Assessment & Plan Note (Signed)
 She takes atorvastatin 40 mg daily.

## 2023-10-15 NOTE — Assessment & Plan Note (Signed)
 Her blood pressure is well controlled.

## 2023-10-20 ENCOUNTER — Ambulatory Visit: Admitting: Internal Medicine

## 2023-11-09 ENCOUNTER — Other Ambulatory Visit: Payer: Self-pay | Admitting: Internal Medicine

## 2023-11-12 ENCOUNTER — Ambulatory Visit: Admitting: Internal Medicine

## 2023-11-12 ENCOUNTER — Encounter: Payer: Self-pay | Admitting: Internal Medicine

## 2023-11-12 VITALS — BP 120/60 | HR 70 | Temp 97.8°F | Resp 18 | Ht 63.0 in | Wt 142.4 lb

## 2023-11-12 DIAGNOSIS — R7303 Prediabetes: Secondary | ICD-10-CM | POA: Diagnosis not present

## 2023-11-12 DIAGNOSIS — G8929 Other chronic pain: Secondary | ICD-10-CM | POA: Diagnosis not present

## 2023-11-12 DIAGNOSIS — E785 Hyperlipidemia, unspecified: Secondary | ICD-10-CM | POA: Diagnosis not present

## 2023-11-12 DIAGNOSIS — Z720 Tobacco use: Secondary | ICD-10-CM

## 2023-11-12 DIAGNOSIS — K5903 Drug induced constipation: Secondary | ICD-10-CM | POA: Diagnosis not present

## 2023-11-12 DIAGNOSIS — R634 Abnormal weight loss: Secondary | ICD-10-CM | POA: Diagnosis not present

## 2023-11-12 DIAGNOSIS — Z23 Encounter for immunization: Secondary | ICD-10-CM

## 2023-11-12 DIAGNOSIS — Z1231 Encounter for screening mammogram for malignant neoplasm of breast: Secondary | ICD-10-CM

## 2023-11-12 DIAGNOSIS — I10 Essential (primary) hypertension: Secondary | ICD-10-CM

## 2023-11-12 DIAGNOSIS — M5442 Lumbago with sciatica, left side: Secondary | ICD-10-CM | POA: Diagnosis not present

## 2023-11-12 DIAGNOSIS — T402X5A Adverse effect of other opioids, initial encounter: Secondary | ICD-10-CM

## 2023-11-12 MED ORDER — OXYCODONE-ACETAMINOPHEN 10-325 MG PO TABS: 1.0000 | ORAL_TABLET | Freq: Three times a day (TID) | ORAL | 0 refills | Status: DC | PRN

## 2023-11-12 NOTE — Progress Notes (Signed)
 Office Visit  Subjective   Patient ID: Yolanda Donaldson   DOB: December 12, 1955   Age: 68 y.o.   MRN: [id number]   Chief Complaint Chief Complaint  Patient presents with   Hypertension    1 month follow up     History of Present Illness 68 years old female is here for follow up. She says that she could not come for blood draw and could not do stool occult blood either because of lack of transport.   She has lost 2 lb since last visit.  Her BMI is 25. She has a chronic cough and also never have colonoscopy.  She says that she is not trying to lose weight. She denies being depress.    She has a chronic back pain and she says that her pain have not changed. She described this pain 4   out of 10 in intensity.  She says the pain medication helped with her pain only for 3-4 hours. Her pain get worse with walking. The pain is aggravated by prolonged standing, sitting, and walking The pain is alleviated by with pain medications    The patient is a 68 year old Caucasian/White female who has hypertension. The patient has not been checking her blood pressure at home. The patient's current medications include: AMLODIPINE  BESYLATE 10 MG TAB and lisinopril 20 mg-hydrochlorothiazide 12.5 mg tablet. The patient has been tolerating her medications well. The patient denies any chest pain, shortness of breath, orthopnea, and PND.  She reports there have been no other symptoms noted.    Yolanda Donaldson returns today for routine followup on her cholesterol. Overall, she states she is doing well and is without any complaints or problems at this time. She specifically denies chest pain, abdominal pain, nausea, diarrhea, and myalgias. She remains on dietary management as well as the following cholesterol lowering medications atorvastatin  40 mg tablet. She has labs drawn in 11/24 and that was reviewed with her.   She has borderline diabetes and her hemoglobin A1c was 5.16 November 2022.  Past Medical History Past Medical  History:  Diagnosis Date   Stroke [facility])      Allergies No Known Allergies   Review of Systems Review of Systems  Constitutional: Negative.   Gastrointestinal:        No appetite and easily get ful  Musculoskeletal:  Positive for back pain.       Objective:    Vitals BP 120/60   Pulse 70   Temp 97.8 F (36.6 C)   Resp 18   Ht 5' 3 (1.6 m)   Wt 142 lb 6 oz (64.6 kg)   SpO2 97%   BMI 25.22 kg/m    Physical Examination Physical Exam Constitutional:      Appearance: Normal appearance.  HENT:     Head: Normocephalic and atraumatic.  Cardiovascular:     Rate and Rhythm: Normal rate and regular rhythm.     Heart sounds: Normal heart sounds.  Pulmonary:     Effort: Pulmonary effort is normal.     Breath sounds: Normal breath sounds.  Abdominal:     General: Bowel sounds are normal.     Palpations: Abdomen is soft.  Neurological:     General: No focal deficit present.     Mental Status: She is alert and oriented to person, place, and time.        Assessment & Plan:   Tobacco abuse She has unintentional weight loss and 1 PPD tobacco use since  she was 68 years old ( 45 years ).    Weight loss I will do CBC, TSH and FIT test for cancer screening due to unintentional weight loss.   Chronic bilateral low back pain with left-sided sciatica She take percocet and I will send percocet for pain medicine refill  Primary hypertension  Her blood pressure is well controlled.  Constipation due to opioid therapy   Her constipation is better per patient.    Return in about 1 month (around 12/13/2023).   Roetta Dare, MD

## 2023-11-12 NOTE — Assessment & Plan Note (Signed)
 She take percocet and I will send percocet for pain medicine refill

## 2023-11-12 NOTE — Assessment & Plan Note (Signed)
 I will do CBC, TSH and FIT test for cancer screening due to unintentional weight loss.

## 2023-11-12 NOTE — Assessment & Plan Note (Signed)
 She has unintentional weight loss and 1 PPD tobacco use since she was 68 years old ( 45 years ).

## 2023-11-13 LAB — LIPID PANEL
Chol/HDL Ratio: 3.4 ratio (ref 0.0–4.4)
Cholesterol, Total: 168 mg/dL (ref 100–199)
HDL: 50 mg/dL (ref >39)
LDL Chol Calc (NIH): 93 mg/dL (ref 0–99)
Triglycerides: 142 mg/dL (ref 0–149)
VLDL Cholesterol Cal: 25 mg/dL (ref 5–40)

## 2023-11-13 LAB — CMP14 + ANION GAP
ALT: 9 IU/L (ref 0–32)
AST: 15 IU/L (ref 0–40)
Albumin: 4.4 g/dL (ref 3.9–4.9)
Alkaline Phosphatase: 153 IU/L — ABNORMAL HIGH (ref 49–135)
Anion Gap: 19 mmol/L — ABNORMAL HIGH (ref 10.0–18.0)
BUN/Creatinine Ratio: 5 — ABNORMAL LOW (ref 12–28)
BUN: 5 mg/dL — ABNORMAL LOW (ref 8–27)
Bilirubin Total: 0.5 mg/dL (ref 0.0–1.2)
CO2: 20 mmol/L (ref 20–29)
Calcium: 9.4 mg/dL (ref 8.7–10.3)
Chloride: 97 mmol/L (ref 96–106)
Creatinine, Ser: 0.91 mg/dL (ref 0.57–1.00)
Globulin, Total: 2.6 g/dL (ref 1.5–4.5)
Glucose: 87 mg/dL (ref 70–99)
Potassium: 3.7 mmol/L (ref 3.5–5.2)
Sodium: 136 mmol/L (ref 134–144)
Total Protein: 7 g/dL (ref 6.0–8.5)
eGFR: 69 mL/min/1.73 (ref >59)

## 2023-11-13 LAB — CBC WITH DIFFERENTIAL/PLATELET
Basophils Absolute: 0.1 x10E3/uL (ref 0.0–0.2)
Basos: 1 %
EOS (ABSOLUTE): 0.1 x10E3/uL (ref 0.0–0.4)
Eos: 2 %
Hematocrit: 48.9 % — ABNORMAL HIGH (ref 34.0–46.6)
Hemoglobin: 16 g/dL — ABNORMAL HIGH (ref 11.1–15.9)
Immature Grans (Abs): 0 x10E3/uL (ref 0.0–0.1)
Immature Granulocytes: 0 %
Lymphocytes Absolute: 2.8 x10E3/uL (ref 0.7–3.1)
Lymphs: 36 %
MCH: 30.3 pg (ref 26.6–33.0)
MCHC: 32.7 g/dL (ref 31.5–35.7)
MCV: 93 fL (ref 79–97)
Monocytes Absolute: 0.5 x10E3/uL (ref 0.1–0.9)
Monocytes: 7 %
Neutrophils Absolute: 4.2 x10E3/uL (ref 1.4–7.0)
Neutrophils: 53 %
Platelets: 267 x10E3/uL (ref 150–450)
RBC: 5.28 x10E6/uL (ref 3.77–5.28)
RDW: 14 % (ref 11.7–15.4)
WBC: 7.7 x10E3/uL (ref 3.4–10.8)

## 2023-11-13 LAB — HEMOGLOBIN A1C
Est. average glucose Bld gHb Est-mCnc: 117 mg/dL
Hgb A1c MFr Bld: 5.7 % — ABNORMAL HIGH (ref 4.8–5.6)

## 2023-11-13 LAB — TSH: TSH: 1.64 u[IU]/mL (ref 0.450–4.500)

## 2023-11-14 NOTE — Assessment & Plan Note (Signed)
 Her blood pressure is well controlled.

## 2023-11-14 NOTE — Assessment & Plan Note (Signed)
 Her constipation is better per patient.

## 2023-11-15 ENCOUNTER — Other Ambulatory Visit: Payer: Self-pay

## 2023-11-26 DIAGNOSIS — J811 Chronic pulmonary edema: Secondary | ICD-10-CM | POA: Diagnosis not present

## 2023-11-26 DIAGNOSIS — F17210 Nicotine dependence, cigarettes, uncomplicated: Secondary | ICD-10-CM | POA: Diagnosis not present

## 2023-11-26 DIAGNOSIS — Z122 Encounter for screening for malignant neoplasm of respiratory organs: Secondary | ICD-10-CM | POA: Diagnosis not present

## 2023-11-29 DIAGNOSIS — R918 Other nonspecific abnormal finding of lung field: Secondary | ICD-10-CM | POA: Diagnosis not present

## 2023-11-29 DIAGNOSIS — Z0489 Encounter for examination and observation for other specified reasons: Secondary | ICD-10-CM | POA: Diagnosis not present

## 2023-11-29 DIAGNOSIS — Z87891 Personal history of nicotine dependence: Secondary | ICD-10-CM | POA: Diagnosis not present

## 2023-12-10 ENCOUNTER — Ambulatory Visit: Admitting: Internal Medicine

## 2023-12-10 ENCOUNTER — Encounter: Payer: Self-pay | Admitting: Internal Medicine

## 2023-12-10 VITALS — BP 118/60 | HR 64 | Temp 97.8°F | Resp 18 | Ht 63.0 in | Wt 145.4 lb

## 2023-12-10 DIAGNOSIS — E785 Hyperlipidemia, unspecified: Secondary | ICD-10-CM

## 2023-12-10 DIAGNOSIS — I10 Essential (primary) hypertension: Secondary | ICD-10-CM | POA: Diagnosis not present

## 2023-12-10 DIAGNOSIS — R7303 Prediabetes: Secondary | ICD-10-CM

## 2023-12-10 DIAGNOSIS — M5442 Lumbago with sciatica, left side: Secondary | ICD-10-CM

## 2023-12-10 DIAGNOSIS — G8929 Other chronic pain: Secondary | ICD-10-CM

## 2023-12-10 MED ORDER — OXYCODONE-ACETAMINOPHEN 10-325 MG PO TABS: 1.0000 | ORAL_TABLET | Freq: Three times a day (TID) | ORAL | 0 refills | Status: DC | PRN

## 2023-12-10 NOTE — Assessment & Plan Note (Signed)
 She will continue with atorvastatin  40 mg daily.  She will also take aspirin 81 mg for stroke prevention.

## 2023-12-10 NOTE — Assessment & Plan Note (Signed)
 Her hemoglobin A1c was 5.7 last month.  She will continue to watch her diet.

## 2023-12-10 NOTE — Progress Notes (Signed)
 Office Visit  Subjective   Patient ID: Yolanda Donaldson   DOB: Aug 21, 1955   Age: 68 y.o.   MRN: [id number]   Chief Complaint Chief Complaint  Patient presents with   Follow-up    1 month     History of Present Illness 68 years old female is here for follow up.   She has CT scan chest done 10 days ago but I have not seen the report yet.  She says that she has not heard back from gastroenterologist.  She has gained 4 lb since last visit.  Her weight is 145 lb with BMI of 25. She has a chronic cough and also never have colonoscopy.  I have refer her to see gastroenterologist.     She says that she has blacked out in her left eye sometime.  She has a history of stroke and she does not take baby aspirin.  I have suggested to see eye doctor.  May need to do carotid ultrasound as well.   She has a chronic back pain and she says that her pain have not changed. She is due for pain refill next week and I will send prescription for her. She described this pain 4   out of 10 in intensity.  She says the pain medication helped with her pain only for 3-4 hours. Her pain get worse with walking. The pain is aggravated by prolonged standing, sitting, and walking The pain is alleviated by with pain medications    The patient is a 68 year old Caucasian/White female who has hypertension. The patient has not been checking her blood pressure at home. The patient's current medications include: AMLODIPINE  BESYLATE 10 MG TAB and lisinopril 20 mg-hydrochlorothiazide 12.5 mg tablet. The patient has been tolerating her medications well. The patient denies any chest pain, shortness of breath, orthopnea, and PND.  She reports there have been no other symptoms noted.    Omar Orrego returns today for routine followup on her cholesterol. Overall, she states she is doing well and is without any complaints or problems at this time. She specifically denies chest pain, abdominal pain, nausea, diarrhea, and myalgias. She  remains on dietary management as well as the following cholesterol lowering medications atorvastatin  40 mg tablet. She has labs drawn  On October was 3rd, 2025 and her LDL was 93.   She has borderline diabetes and her hemoglobin A1c was 5.7 on November 12, 2023.  Past Medical History Past Medical History:  Diagnosis Date   Stroke [facility])      Allergies No Known Allergies   Review of Systems Review of Systems  Constitutional: Negative.   HENT: Negative.    Respiratory: Negative.    Cardiovascular: Negative.   Gastrointestinal: Negative.   Musculoskeletal:  Positive for back pain.  Neurological: Negative.        Objective:    Vitals BP 118/60 (BP Location: Left Arm, Patient Position: Sitting, Cuff Size: Normal)   Pulse 64   Temp 97.8 F (36.6 C)   Resp 18   Ht 5' 3 (1.6 m)   Wt 145 lb 6 oz (65.9 kg)   SpO2 97%   BMI 25.75 kg/m    Physical Examination Physical Exam Constitutional:      Appearance: Normal appearance.  HENT:     Head: Normocephalic and atraumatic.  Cardiovascular:     Rate and Rhythm: Normal rate and regular rhythm.     Heart sounds: Normal heart sounds.  Pulmonary:  Effort: Pulmonary effort is normal.     Breath sounds: Normal breath sounds.  Abdominal:     General: Bowel sounds are normal.     Palpations: Abdomen is soft.  Neurological:     General: No focal deficit present.     Mental Status: She is alert and oriented to person, place, and time.        Assessment & Plan:   Primary hypertension  Her blood pressure is well controlled.  Chronic bilateral low back pain with left-sided sciatica   I will send refill of her pain medication.  Dyslipidemia   She will continue with atorvastatin  40 mg daily.  She will also take aspirin 81 mg for stroke prevention.  Borderline diabetes mellitus   Her hemoglobin A1c was 5.7 last month.  She will continue to watch her diet.    Return in about 2 months (around 02/09/2024).   Roetta Dare, MD

## 2023-12-10 NOTE — Assessment & Plan Note (Signed)
 Her blood pressure is well controlled.

## 2023-12-10 NOTE — Assessment & Plan Note (Signed)
I will send refill of her pain medication.

## 2024-01-12 ENCOUNTER — Other Ambulatory Visit: Payer: Self-pay | Admitting: Internal Medicine

## 2024-01-14 ENCOUNTER — Other Ambulatory Visit: Payer: Self-pay | Admitting: Internal Medicine

## 2024-01-14 MED ORDER — OXYCODONE-ACETAMINOPHEN 10-325 MG PO TABS: 1.0000 | ORAL_TABLET | Freq: Three times a day (TID) | ORAL | 0 refills | Status: DC | PRN

## 2024-02-07 ENCOUNTER — Encounter: Payer: Self-pay | Admitting: Internal Medicine

## 2024-02-07 ENCOUNTER — Ambulatory Visit: Admitting: Internal Medicine

## 2024-02-07 VITALS — BP 130/80 | HR 68 | Temp 97.8°F | Resp 18 | Wt 152.0 lb

## 2024-02-07 DIAGNOSIS — G8929 Other chronic pain: Secondary | ICD-10-CM

## 2024-02-07 DIAGNOSIS — E785 Hyperlipidemia, unspecified: Secondary | ICD-10-CM

## 2024-02-07 DIAGNOSIS — Z1211 Encounter for screening for malignant neoplasm of colon: Secondary | ICD-10-CM

## 2024-02-07 DIAGNOSIS — M5442 Lumbago with sciatica, left side: Secondary | ICD-10-CM

## 2024-02-07 DIAGNOSIS — K5903 Drug induced constipation: Secondary | ICD-10-CM

## 2024-02-07 DIAGNOSIS — I10 Essential (primary) hypertension: Secondary | ICD-10-CM

## 2024-02-07 DIAGNOSIS — T402X5A Adverse effect of other opioids, initial encounter: Secondary | ICD-10-CM

## 2024-02-07 DIAGNOSIS — R7303 Prediabetes: Secondary | ICD-10-CM

## 2024-02-07 LAB — POCT URINE DRUG SCREEN
Methylenedioxyamphetamine: NOT DETECTED
POC Amphetamine UR: NOT DETECTED
POC BENZODIAZEPINES UR: NOT DETECTED
POC Barbiturate UR: NOT DETECTED
POC Cocaine UR: NOT DETECTED
POC DRUG SCREEN OXIDANTS URINE: NORMAL
POC Ecstasy UR: NOT DETECTED
POC Marijuana UR: NOT DETECTED
POC Methadone UR: NOT DETECTED
POC Methamphetamine UR: NOT DETECTED
POC Opiate Ur: POSITIVE — AB
POC Oxycodone UR: NOT DETECTED
POC PH URINE: NORMAL
POC PHENCYCLIDINE UR: NOT DETECTED
POC SPECIFIC GRAVITY URINE: NORMAL
POC TRICYCLICS UR: NOT DETECTED
URINE TEMPERATURE: 90 [degF] (ref 90.0–100.0)

## 2024-02-07 MED ORDER — OXYCODONE-ACETAMINOPHEN 10-325 MG PO TABS: 1.0000 | ORAL_TABLET | Freq: Three times a day (TID) | ORAL | 0 refills | Status: DC | PRN

## 2024-02-07 NOTE — Assessment & Plan Note (Signed)
 Her constipation is better.

## 2024-02-07 NOTE — Addendum Note (Signed)
 Addended byBETHA HILDEGARD ERNST on: 02/07/2024 11:41 AM   Modules accepted: Orders

## 2024-02-07 NOTE — Addendum Note (Signed)
 Addended byBETHA HILDEGARD ERNST on: 02/07/2024 11:43 AM   Modules accepted: Orders

## 2024-02-07 NOTE — Assessment & Plan Note (Signed)
"    I will do urine drug screening and will send refill of her prescription.  We will do random pill count as well. "

## 2024-02-07 NOTE — Progress Notes (Signed)
 "  Office Visit  Subjective   Patient ID: Yolanda Donaldson   DOB: [birth year]   Age: 68 y.o.   MRN: [id number]   Chief Complaint Chief Complaint  Patient presents with   Follow-up    2 month follow up     History of Present Illness 68 years old female is here for follow up. She says that she never heard back about CT scan and mammogram. She never seen gastroenterologist either.   She says that her back pain is 4 to 5/10 in intensity with pain medications. She takes oxycodone  10/325 mg 1 tablet 3 times a day.  Pain radiates to left leg. She has a chronic back pain. Her pain get worse with walking. The pain is aggravated by prolonged standing, sitting, and walking The pain is alleviated by with pain medications    The patient is a 68 year old Caucasian/White female who has hypertension. The patient has not been checking her blood pressure at home. The patient's current medications include: AMLODIPINE  BESYLATE 10 MG TAB and lisinopril 20 mg-hydrochlorothiazide 12.5 mg tablet. The patient has been tolerating her medications well. The patient denies any chest pain, shortness of breath, orthopnea, and PND.  She reports there have been no other symptoms noted.    Yolanda Donaldson returns today for routine followup on her cholesterol. Overall, she states she is doing well and is without any complaints or problems at this time. She specifically denies chest pain, abdominal pain, nausea, diarrhea, and myalgias. She remains on dietary management as well as the following cholesterol lowering medications atorvastatin  40 mg tablet. She has labs drawn  On October was 3rd, 2025 and her LDL was 93.   She has borderline diabetes and her hemoglobin A1c was 5.7 on November 12, 2023.  Past Medical History Past Medical History:  Diagnosis Date   Stroke [facility])      Allergies Allergies[1]   Review of Systems Review of Systems  Constitutional: Negative.   HENT: Negative.    Respiratory: Negative.     Cardiovascular: Negative.   Gastrointestinal: Negative.   Musculoskeletal:  Positive for back pain.  Neurological: Negative.        Objective:    Vitals BP 130/80   Pulse 68   Temp 97.8 F (36.6 C)   Resp 18   Wt 152 lb (68.9 kg)   SpO2 98%   BMI 26.93 kg/m    Physical Examination Physical Exam Constitutional:      Appearance: Normal appearance.  HENT:     Head: Normocephalic and atraumatic.  Cardiovascular:     Rate and Rhythm: Normal rate and regular rhythm.     Heart sounds: Normal heart sounds.  Pulmonary:     Effort: Pulmonary effort is normal.     Breath sounds: Normal breath sounds.  Abdominal:     General: Bowel sounds are normal.     Palpations: Abdomen is soft.  Neurological:     Mental Status: She is alert and oriented to person, place, and time. Mental status is at baseline.        Assessment & Plan:   Primary hypertension   Her blood pressure is controlled.  Constipation due to opioid therapy   Her constipation is better.  Chronic bilateral low back pain with left-sided sciatica   I will do urine drug screening and will send refill of her prescription.  We will do random pill count as well.  Dyslipidemia   Her cholesterol is controlled.  She take  atorvastatin  40 mg daily without any side effect.  Borderline diabetes mellitus   Her hemoglobin A1c was 5.7 that is stable borderline diabetes.     I will send Cologuard for her.  Return in about 1 month (around 03/09/2024).   Roetta Dare, MD      [1] No Known Allergies  "

## 2024-02-07 NOTE — Assessment & Plan Note (Signed)
 Her blood pressure is controlled.

## 2024-02-07 NOTE — Assessment & Plan Note (Signed)
"    Her hemoglobin A1c was 5.7 that is stable borderline diabetes. "

## 2024-02-07 NOTE — Assessment & Plan Note (Signed)
"    Her cholesterol is controlled.  She take atorvastatin  40 mg daily without any side effect. "

## 2024-02-09 LAB — DRUG SCREEN 16 WITH REFLEX CONFIRMATION

## 2024-03-08 ENCOUNTER — Ambulatory Visit: Admitting: Internal Medicine

## 2024-03-08 ENCOUNTER — Encounter: Payer: Self-pay | Admitting: Internal Medicine

## 2024-03-08 VITALS — BP 120/60 | HR 54 | Temp 97.2°F | Resp 18 | Ht 63.0 in | Wt 157.5 lb

## 2024-03-08 DIAGNOSIS — G8929 Other chronic pain: Secondary | ICD-10-CM

## 2024-03-08 DIAGNOSIS — I10 Essential (primary) hypertension: Secondary | ICD-10-CM | POA: Diagnosis not present

## 2024-03-08 DIAGNOSIS — E785 Hyperlipidemia, unspecified: Secondary | ICD-10-CM | POA: Diagnosis not present

## 2024-03-08 DIAGNOSIS — K5903 Drug induced constipation: Secondary | ICD-10-CM

## 2024-03-08 DIAGNOSIS — Z79891 Long term (current) use of opiate analgesic: Secondary | ICD-10-CM | POA: Diagnosis not present

## 2024-03-08 DIAGNOSIS — M5442 Lumbago with sciatica, left side: Secondary | ICD-10-CM

## 2024-03-08 LAB — POCT URINE DRUG SCREEN
Methylenedioxyamphetamine: NOT DETECTED
POC Amphetamine UR: NOT DETECTED
POC BENZODIAZEPINES UR: NOT DETECTED
POC Barbiturate UR: NOT DETECTED
POC Cocaine UR: NOT DETECTED
POC Ecstasy UR: NOT DETECTED
POC Marijuana UR: NOT DETECTED
POC Methadone UR: NOT DETECTED
POC Methamphetamine UR: NOT DETECTED
POC Opiate Ur: POSITIVE — AB
POC Oxycodone UR: NOT DETECTED
POC PHENCYCLIDINE UR: NOT DETECTED
POC TRICYCLICS UR: NOT DETECTED

## 2024-03-08 NOTE — Assessment & Plan Note (Signed)
 Her blood pressure is controlled.

## 2024-03-08 NOTE — Assessment & Plan Note (Signed)
"    She has stopped taking Percocet and started Suboxone 1 tablet 3 times a day.  She says that it is helping her better. "

## 2024-03-08 NOTE — Assessment & Plan Note (Signed)
"    She take atorvastatin  40 mg daily and denies any side effect. "

## 2024-03-08 NOTE — Assessment & Plan Note (Signed)
"    She takes Linzess  145 daily.  She says that her constipation is better. "

## 2024-03-08 NOTE — Addendum Note (Signed)
 Addended by: Nerissa Constantin on: 03/08/2024 03:03 PM   Modules accepted: Orders

## 2024-03-08 NOTE — Progress Notes (Signed)
 "  Office Visit  Subjective   Patient ID: Yolanda Donaldson   DOB: [birth year]   Age: 69 y.o.   MRN: [id number]   Chief Complaint Chief Complaint  Patient presents with   Follow-up    1 month     History of Present Illness 69 years old female is here for follow up.  [facility] and she started taking Suboxone 3 times a day.  She says that she is feeling better and she has stopped taking Percocet for back pain.  She is asking if I can write a prescription for Suboxone because she does not wanted to go to pain clinic.  I have suggested to continue with them because that is helping her.  She says that she do not have any side effects like Percocet and her pain is also better.   She has chronic back pain of many years duration. Pain radiates to left leg. She has a chronic back pain. Her pain get worse with walking. The pain is aggravated by prolonged standing, sitting, and walking The pain is alleviated by with pain medications    The patient is a 69 year old Caucasian/White female who has hypertension. The patient has not been checking her blood pressure at home. The patient's current medications include: AMLODIPINE  BESYLATE 10 MG TAB and lisinopril 20 mg-hydrochlorothiazide 12.5 mg tablet. The patient has been tolerating her medications well. The patient denies any chest pain, shortness of breath, orthopnea, and PND.  She reports there have been no other symptoms noted.    Yolanda Donaldson returns today for routine followup on her cholesterol. Overall, she states she is doing well and is without any complaints or problems at this time. She specifically denies chest pain, abdominal pain, nausea, diarrhea, and myalgias. She remains on dietary management as well as the following cholesterol lowering medications atorvastatin  40 mg tablet. She has labs drawn  On October was 3rd, 2025 and her LDL was 93.   She has borderline diabetes and her hemoglobin A1c was 5.7 on November 12, 2023.  Past Medical History Past Medical History:  Diagnosis Date   Stroke [facility])      Allergies Allergies[1]   Review of Systems Review of Systems  Constitutional: Negative.   HENT: Negative.    Respiratory: Negative.    Cardiovascular: Negative.   Gastrointestinal: Negative.   Musculoskeletal:  Positive for back pain.  Neurological: Negative.        Objective:    Vitals BP 120/60   Pulse (!) 54   Temp (!) 97.2 F (36.2 C)   Resp 18   Ht 5' 3 (1.6 m)   Wt 157 lb 8 oz (71.4 kg)   SpO2 92%   BMI 27.90 kg/m    Physical Examination Physical Exam Constitutional:      Appearance: Normal appearance.  HENT:     Head: Normocephalic and atraumatic.  Cardiovascular:     Rate and Rhythm: Normal rate and regular rhythm.     Heart sounds: Normal heart sounds.  Pulmonary:     Effort: Pulmonary effort is normal.     Breath sounds: Normal breath sounds.  Abdominal:     General: Bowel sounds are normal.     Palpations: Abdomen is soft.  Neurological:     General: No focal deficit present.     Mental Status: She is alert and oriented to person, place, and time.        Assessment & Plan:  Primary hypertension   Her blood pressure is controlled.  Constipation due to opioid therapy   She takes Linzess  145 daily.  She says that her constipation is better.  Chronic bilateral low back pain with left-sided sciatica   She has stopped taking Percocet and started Suboxone 1 tablet 3 times a day.  She says that it is helping her better.  Dyslipidemia   She take atorvastatin  40 mg daily and denies any side effect.    Return in about 3 months (around 06/06/2024).   Roetta Dare, MD      [1] No Known Allergies  "

## 2024-06-05 ENCOUNTER — Ambulatory Visit: Admitting: Internal Medicine
